# Patient Record
Sex: Female | Born: 1970 | Race: White | Hispanic: No
Health system: Southern US, Community
[De-identification: ages and names within clinical notes are randomized; demographics above are authoritative.]

## PROBLEM LIST (undated history)

## (undated) DIAGNOSIS — F329 Major depressive disorder, single episode, unspecified: Secondary | ICD-10-CM

## (undated) DIAGNOSIS — K529 Noninfective gastroenteritis and colitis, unspecified: Secondary | ICD-10-CM

## (undated) DIAGNOSIS — E039 Hypothyroidism, unspecified: Secondary | ICD-10-CM

## (undated) DIAGNOSIS — F32A Depression, unspecified: Secondary | ICD-10-CM

## (undated) DIAGNOSIS — I10 Essential (primary) hypertension: Secondary | ICD-10-CM

## (undated) HISTORY — PX: OTHER SURGICAL HISTORY: SHX169

## (undated) HISTORY — PX: BACK SURGERY: SHX140

## (undated) HISTORY — DX: Essential (primary) hypertension: I10

## (undated) HISTORY — PX: TYMPANOSTOMY TUBE PLACEMENT: SHX32

---

## 2005-05-07 DIAGNOSIS — R5383 Other fatigue: Secondary | ICD-10-CM | POA: Insufficient documentation

## 2005-05-07 DIAGNOSIS — D729 Disorder of white blood cells, unspecified: Secondary | ICD-10-CM | POA: Insufficient documentation

## 2005-05-07 DIAGNOSIS — R519 Headache, unspecified: Secondary | ICD-10-CM | POA: Insufficient documentation

## 2016-04-27 ENCOUNTER — Encounter: Payer: Self-pay | Admitting: Emergency Medicine

## 2016-04-27 ENCOUNTER — Emergency Department
Admission: EM | Admit: 2016-04-27 | Discharge: 2016-04-27 | Disposition: A | Discharge status: Home / Self Care | Payer: Self-pay | Attending: Emergency Medicine | Admitting: Emergency Medicine

## 2016-04-27 ENCOUNTER — Emergency Department: Payer: Self-pay

## 2016-04-27 DIAGNOSIS — M62838 Other muscle spasm: Secondary | ICD-10-CM | POA: Insufficient documentation

## 2016-04-27 DIAGNOSIS — M545 Low back pain: Secondary | ICD-10-CM | POA: Insufficient documentation

## 2016-04-27 DIAGNOSIS — G8929 Other chronic pain: Secondary | ICD-10-CM | POA: Insufficient documentation

## 2016-04-27 DIAGNOSIS — E039 Hypothyroidism, unspecified: Secondary | ICD-10-CM | POA: Insufficient documentation

## 2016-04-27 HISTORY — DX: Noninfective gastroenteritis and colitis, unspecified: K52.9

## 2016-04-27 HISTORY — DX: Major depressive disorder, single episode, unspecified: F32.9

## 2016-04-27 HISTORY — DX: Depression, unspecified: F32.A

## 2016-04-27 HISTORY — DX: Hypothyroidism, unspecified: E03.9

## 2016-04-27 MED ORDER — BACLOFEN 10 MG PO TABS: 10.0000 mg | ORAL_TABLET | Freq: Three times a day (TID) | ORAL | 0 refills | Status: AC | PRN

## 2016-04-27 NOTE — ED Triage Notes (Signed)
Pt reports 1 week ago she thought she had slept weird and started having right shoulder pain. Pt reports pain continues. Denies injury.

## 2016-04-27 NOTE — ED Provider Notes (Signed)
[facility] Emergency Department Provider Note  ____________________________________________  Time seen: Approximately 3:46 PM  I have reviewed the triage vital signs and the nursing notes.   HISTORY  Chief Complaint Shoulder Pain    HPI Anna HockeyJacqueline H Tumolo is a 45 y.o. female, NAD, presents to the emergency for 1 week history of right shoulder and neck pain.  Patient states she woke last Friday with right shoulder and neck pain that has progressively worsened over the last week. Has been taking over-the-counter ibuprofen as well as tramadol which she has prescription for back pain. States neither has helped alleviate her symptoms. States the right side of the neck and shoulder feel tight. Has not had any falls, injuries or traumas. Has not noted any redness or rashes.  Denies numbness, weakness, tingling. Has had no chest pain, shortness breath, abdominal pain, nausea or vomiting.   Past Medical History:  Diagnosis Date  . Colitis   . Depression   . Hypothyroidism     There are no active problems to display for this patient.   Past Surgical History:  Procedure Laterality Date  . BACK SURGERY    . CESAREAN SECTION    . uteral ablasion      Prior to Admission medications   Medication Sig Start Date End Date Taking? Authorizing Provider  baclofen (LIORESAL) 10 MG tablet Take 1 tablet (10 mg total) by mouth 3 (three) times daily as needed for muscle spasms. 04/27/16   Jamesyn Moorefield L Rayonna Heldman, PA-C    Allergies Fish allergy  No family history on file.  Social History Social History  Substance Use Topics  . Smoking status: Not on file  . Smokeless tobacco: Not on file  . Alcohol use Not on file     Review of Systems  Constitutional: No fever/chills, Fatigue Cardiovascular: No chest pain. Respiratory:  No shortness of breath. No wheezing.  Gastrointestinal: No abdominal pain.  No nausea, vomiting.   Musculoskeletal: Positive right shoulder and neck  pain. Positive chronic lower back pain. Skin: Negative for rash, Skin sores. Neurological: Negative for numbness, weakness, tingling. 10-point ROS otherwise negative.  ____________________________________________   PHYSICAL EXAM:  VITAL SIGNS: ED Triage Vitals [04/27/16 1326]  Enc Vitals Group     BP (!) 139/93     Pulse Rate 83     Resp 16     Temp 97.6 F (36.4 C)     Temp Source Oral     SpO2 96 %     Weight 250 lb (113.4 kg)     Height 5\' 7"  (1.702 m)     Head Circumference      Peak Flow      Pain Score 8     Pain Loc      Pain Edu?      Excl. in GC?     Constitutional: Alert and oriented. Well appearing and in no acute distress. Eyes: Conjunctivae are normal.  Head: Atraumatic. Neck:  Cervical spine tenderness to palpation. Supple with full range of motion but with pain with right lateral flexion and rotation. Right sided trapezial muscle spasm with mild tenderness to palpation. Cardiovascular: Normal rate, regular rhythm. Normal S1 and S2.  Good peripheral circulation. Respiratory: Normal respiratory effort without tachypnea or retractions. Lungs CTAB with breath sounds noted in all lung fields. Musculoskeletal: Decreased range of motion of the right shoulder with extension or and abduction getting to approximately 120. No tenderness to palpation about the deltoid or lateral upper right arm.  Negative Neer's. Negative Apley's. Grip strength is 5 out of 5 in bilateral hands. Strength of bilateral upper extremities is 5 out of 5. Neurologic:  Normal speech and language. No gross focal neurologic deficits are appreciated. Sensation to light touch grossly intact about the right upper extremity.  Skin:  Skin is warm, dry and intact. No rash noted. Psychiatric: Mood and affect are normal. Speech and behavior are normal. Patient exhibits appropriate insight and judgement.  ____________________________________________  RADIOLOGY I, Hope Pigeon, personally viewed and  evaluated these images (plain radiographs) as part of my medical decision making, as well as reviewing the written report by the radiologist.  Dg Shoulder Right  Result Date: 04/27/2016 CLINICAL DATA:  Right shoulder pain, no known injury, initial encounter EXAM: RIGHT SHOULDER - 2+ VIEW COMPARISON:  None. FINDINGS: There is no evidence of fracture or dislocation. There is no evidence of arthropathy or other focal bone abnormality. Soft tissues are unremarkable. IMPRESSION: No acute abnormality noted. Electronically Signed   By: Alcide Clever M.D.   On: 04/27/2016 15:37    ____________________________________________    PROCEDURES  Procedure(s) performed: None   Procedures   Medications - No data to display   ____________________________________________   INITIAL IMPRESSION / ASSESSMENT AND PLAN / ED COURSE  Pertinent labs & imaging results that were available during my care of the patient were reviewed by me and considered in my medical decision making (see chart for details).  Clinical Course    Patient's diagnosis is consistent with Trapezial muscle spasms. Patient will be discharged home with prescriptions for baclofen to take as directed. Patient advised to complete light range of motion and stretching exercises 20 minutes 3-4 times daily as discussed. Patient is to follow up with Dr. Hyacinth Meeker in orthopedics if symptoms persist past this treatment course. Patient is given ED precautions to return to the ED for any worsening or new symptoms.   ____________________________________________  FINAL CLINICAL IMPRESSION(S) / ED DIAGNOSES  Final diagnoses:  Trapezius muscle spasm      NEW MEDICATIONS STARTED DURING THIS VISIT:  Discharge Medication List as of 04/27/2016  4:02 PM    START taking these medications   Details  baclofen (LIORESAL) 10 MG tablet Take 1 tablet (10 mg total) by mouth 3 (three) times daily as needed for muscle spasms., Starting Fri 04/27/2016,  Print             Hope Pigeon, PA-C 04/27/16 1710    Nita Sickle, MD 04/28/16 1002

## 2017-06-22 IMAGING — CR DG SHOULDER 2+V*R*
1 series · 3 of 3 positions shown · non-contrast
Comparison: None.

CLINICAL DATA: Right shoulder pain, no known injury, initial
encounter

EXAM:
RIGHT SHOULDER - 2+ VIEW

[Series 1: dg shoulder right · 0.14mm/px · 3 of 3 slices shown]
[im 1/3]
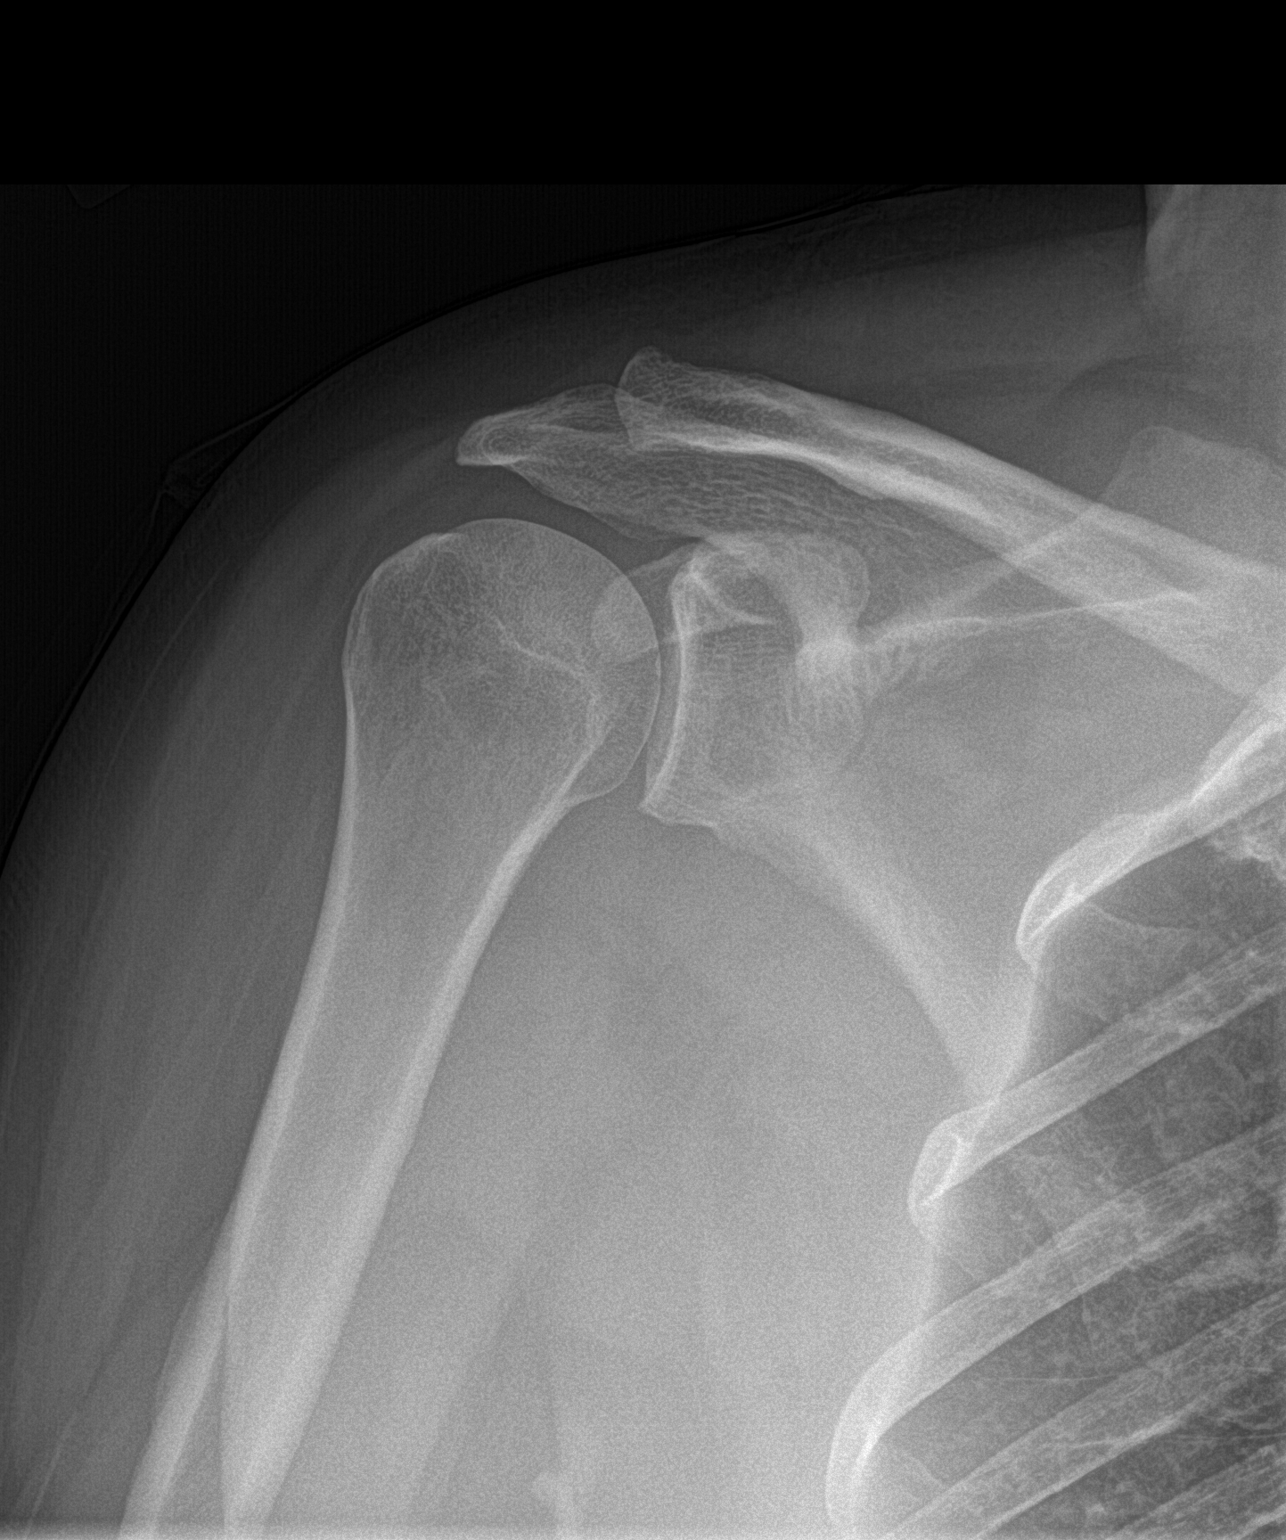
[im 2/3]
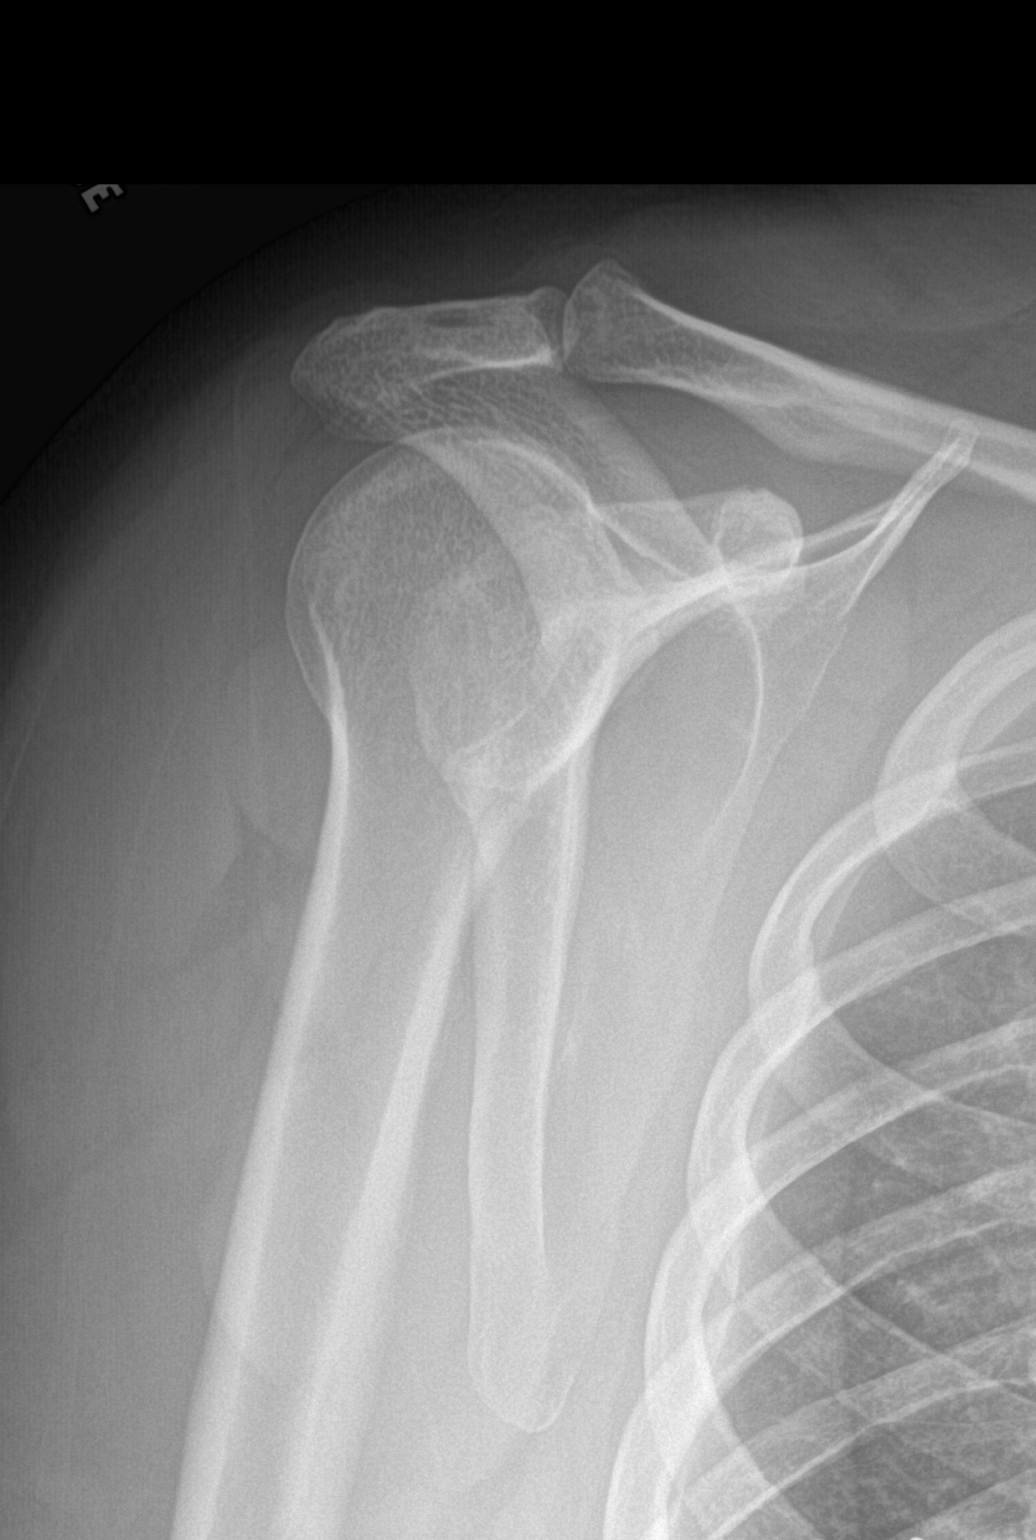
[im 3/3]
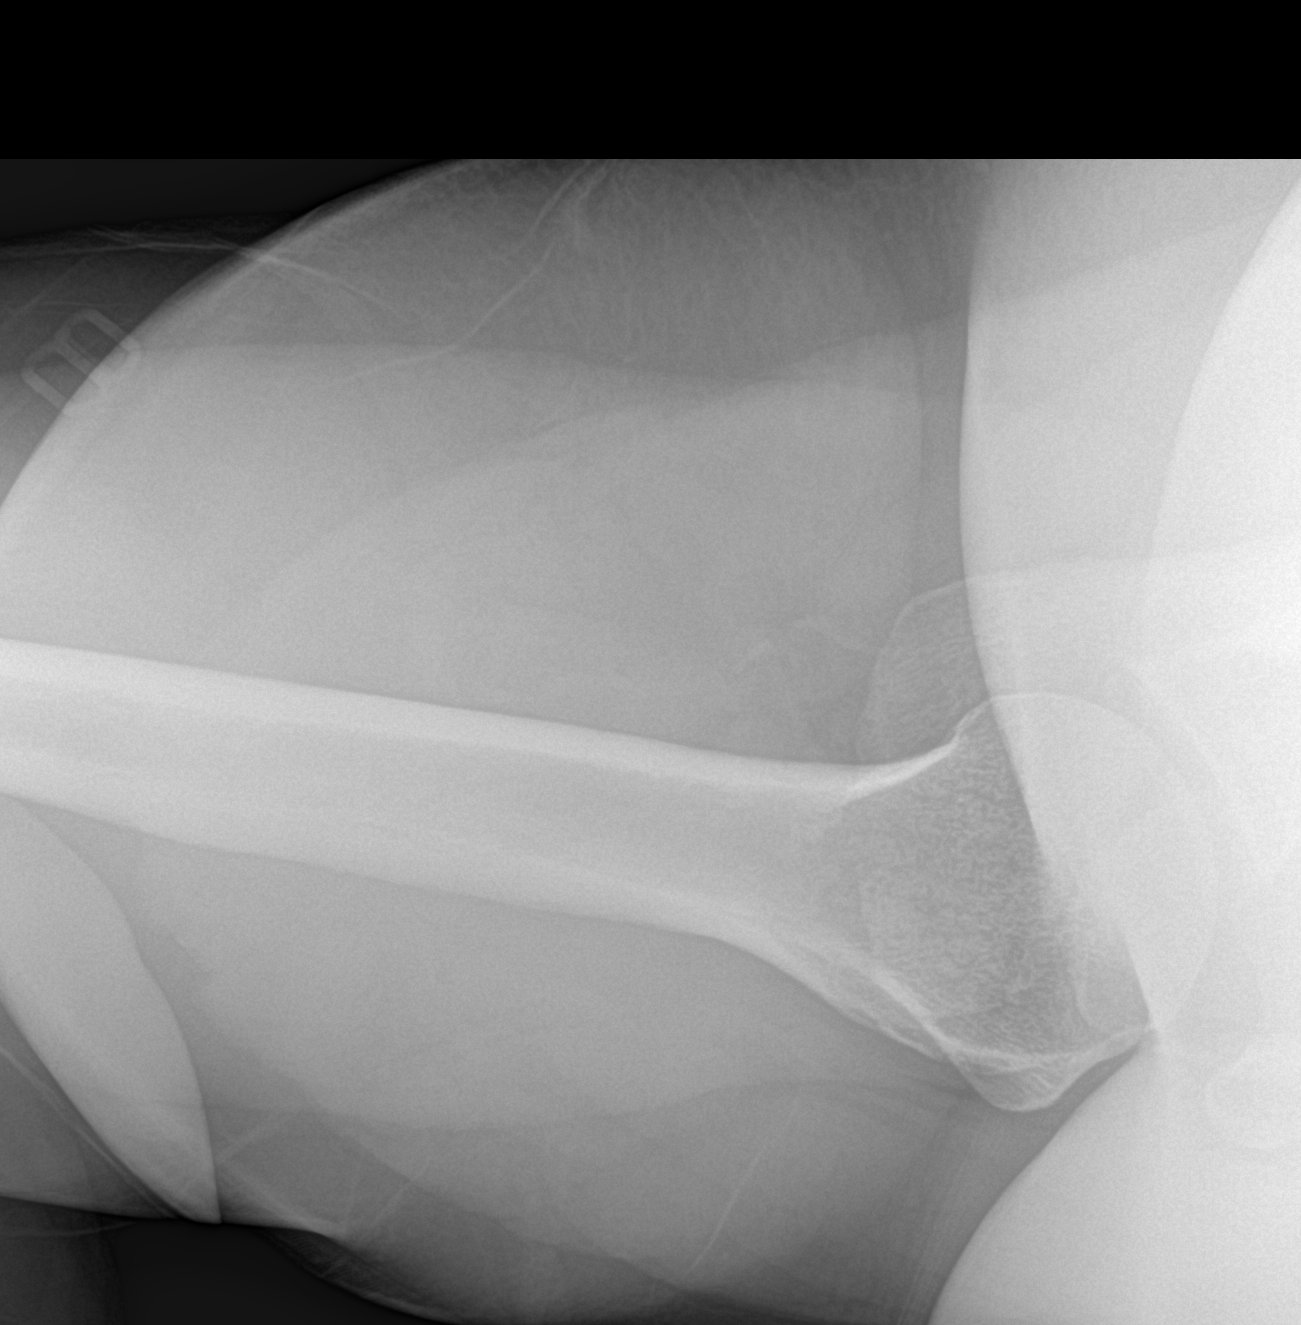

[3 of 3 positions shown; findings below may reference images not displayed]

FINDINGS: There is no evidence of fracture or dislocation. There is no
evidence of arthropathy or other focal bone abnormality. Soft
tissues are unremarkable.
IMPRESSION: No acute abnormality noted.

## 2017-09-05 ENCOUNTER — Ambulatory Visit
Admission: AD | Admit: 2017-09-05 | Discharge: 2017-09-05 | Disposition: A | Discharge status: Home / Self Care | Payer: PRIVATE HEALTH INSURANCE | Source: Ambulatory Visit

## 2017-09-05 DIAGNOSIS — H65112 Acute and subacute allergic otitis media (mucoid) (sanguinous) (serous), left ear: Secondary | ICD-10-CM

## 2017-09-05 DIAGNOSIS — R0600 Dyspnea, unspecified: Secondary | ICD-10-CM | POA: Diagnosis present

## 2017-09-05 MED ORDER — CIPROFLOXACIN-DEXAMETHASONE 0.3-0.1 % OT SUSP *I*
4.0000 [drp] | Freq: Two times a day (BID) | OTIC | 0 refills | Status: DC
Start: 2017-09-05 — End: 2017-09-05

## 2017-09-05 MED ORDER — ALBUTEROL SULFATE HFA 108 (90 BASE) MCG/ACT IN AERS *I*
2.0000 | INHALATION_SPRAY | RESPIRATORY_TRACT | 0 refills | Status: DC | PRN
Start: 2017-09-05 — End: 2019-03-30

## 2017-09-05 MED ORDER — NEOMYCIN-POLYMYXIN-HC 3.5-10000-1 OT SUSP *I*
3.0000 [drp] | Freq: Two times a day (BID) | OTIC | 0 refills | Status: AC
Start: 2017-09-05 — End: 2017-09-12

## 2017-09-05 NOTE — UC Provider Note (Signed)
History     Chief Complaint   Patient presents with    Otalgia     i got [place] comimg out both ears left more then right, chest congestion, started 2 days ago      Presents to clinic with complaints of cough, congestion, rhinitis, bilateral ear pain and drainage x1 day.  Denies F/C/N/V/D.  Reports dyspnea.  Smoker.  Declined flu vaccine.  Taking tylenol for pain.            Medical/Surgical/Family History     History reviewed. No pertinent past medical history.     Patient Active Problem List   Diagnosis Code    Acute mucoid otitis media of left ear H65.112    Dyspnea, unspecified type R06.00            Past Surgical History:   Procedure Laterality Date    CESAREAN SECTION, CLASSIC      l4 l5 fusion      laproscopy      TYMPANOSTOMY TUBE PLACEMENT      uterine ablation       No family history on file.       Social History   Substance Use Topics    Smoking status: Current Every Day Smoker     Packs/day: 0.50    Smokeless tobacco: Never Used    Alcohol use No     Living Situation     Questions Responses    Patient lives with     Homeless     Caregiver for other family member     External Services     Employment     Domestic Violence Risk                 Review of Systems   Review of Systems   Constitutional: Negative.    HENT: Positive for congestion, ear discharge and ear pain.    Eyes: Negative.    Respiratory: Positive for cough and shortness of breath.    Cardiovascular: Negative.    Gastrointestinal: Negative.    Genitourinary: Negative.    Musculoskeletal: Negative.    Skin: Negative.    Neurological: Negative.    Psychiatric/Behavioral: Negative.        Physical Exam   Triage Vitals  Triage Start: Start, (09/05/17 1109)   First Recorded BP: 131/79, Resp: 17, Temp: 36.1 C (97 F), Temp src: Oral Oxygen Therapy SpO2: 97 %, Oximetry Source: Lt Hand, O2 Device: None (Room air), Heart Rate: 89, (09/05/17 1112)  .  First Pain Reported  0-10 Scale: 3, Pain Location/Orientation: Ear Right;Ear Left,  (09/05/17 1112)       Physical Exam   Constitutional: She is oriented to person, place, and time. Vital signs are normal. She is active and cooperative.   HENT:   Left Ear: There is drainage (purulent).   Nose: Nose normal.   Mouth/Throat: Uvula is midline and oropharynx is clear and moist.   Bilateral PE tubes noted with left erthymea and edema of the ear canal and purulent drainage.     Eyes: Pupils are equal, round, and reactive to light. Conjunctivae and EOM are normal.   Neck: Normal range of motion. Neck supple.   Cardiovascular: Normal rate and regular rhythm.    Pulmonary/Chest: Effort normal. She has decreased breath sounds (bilateral).   Neurological: She is alert and oriented to person, place, and time.   Psychiatric: She has a normal mood and affect.   Vitals reviewed.  Medical Decision Making        Initial Evaluation:  ED First Provider Contact     Date/Time Event User Comments    09/05/17 1117 ED First Provider Contact Lora HavensBURKE, Aloma Boch L Initial Face to Face Provider Contact          Patient was seen on: 09/05/2017        Assessment:  47 y.o.female [facility] with   Chief Complaint   Patient presents with    Otalgia     i got us comimg out both ears left more then right, chest congestion, started 2 days ago          Differential Diagnosis includes:  URI  Otalgia  AOM  Dyspnea  Bronchitis    Plan: Albuterol inhaler as needed.  Mucinex for chest congestion.  ciprodex x1 week.         Final Diagnosis  Final diagnoses:   [H65.112] Acute mucoid otitis media of left ear (Primary)   [R06.00] Dyspnea, unspecified type         Marcha DuttonBrandi L Brown Dunlap, NP       I have reviewed nursing documentation, confirmed information with patient, and revised with nursing as necessary.       Marcha DuttonBurke, Mayola Mcbain L, NP  09/05/17 1128

## 2017-09-05 NOTE — Discharge Instructions (Signed)
1. Ciprodex x1 week  2. Tylenol/ibuprofen for pain and fever.  3. Apply warm compresses to affected ear.  4. Follow up with PCP if s/s persist.    Albuterol inhaler as needed.  Mucinex for chest congestion.

## 2017-09-23 DIAGNOSIS — E039 Hypothyroidism, unspecified: Secondary | ICD-10-CM | POA: Insufficient documentation

## 2017-09-23 DIAGNOSIS — R030 Elevated blood-pressure reading, without diagnosis of hypertension: Secondary | ICD-10-CM | POA: Insufficient documentation

## 2018-01-03 DIAGNOSIS — R143 Flatulence: Secondary | ICD-10-CM | POA: Insufficient documentation

## 2018-01-03 DIAGNOSIS — R197 Diarrhea, unspecified: Secondary | ICD-10-CM | POA: Insufficient documentation

## 2018-01-03 DIAGNOSIS — R1084 Generalized abdominal pain: Secondary | ICD-10-CM | POA: Insufficient documentation

## 2018-01-28 NOTE — H&P (Signed)
[facility]      PATIENT: Jaime HockeyJacqueline H Palecek MR #: [id number]   PCP: Ruben ReasonWalkreinard, Lisa L, MD DOB: 12/11/1970   AUTHORED BY: Dionne AnoAYMOND M Crissie Aloi, MD PROCEDURE DATE: 01/30/2018       Chief Complaint: Colonoscopy/Colitis    History of Present Illness  Pt is a 47 y.o.  female presents [facility] center for colonoscopy.  No past medical history on file.  Past Surgical History:   Procedure Laterality Date    CESAREAN SECTION, CLASSIC      l4 l5 fusion      laproscopy      TYMPANOSTOMY TUBE PLACEMENT      uterine ablation       No Known Allergies (drug, envir, food or latex)  Current Outpatient Prescriptions   Medication    levothyroxine (SYNTHROID, LEVOTHROID) 75 MCG tablet    FLUoxetine (PROZAC) 40 MG capsule    albuterol HFA 108 (90 Base) MCG/ACT inhaler     No current facility-administered medications for this encounter.      Social History     Social History    Marital status: Divorced     Spouse name: N/A    Number of children: N/A    Years of education: N/A     Occupational History    Not on file.     Social History Main Topics    Smoking status: Current Every Day Smoker     Packs/day: 0.50    Smokeless tobacco: Never Used    Alcohol use No    Drug use: No    Sexual activity: Not on file     Social History Narrative    No narrative on file       No family history on file.      Review of Systems:  Pertinent items are noted in HPI.    Physical Exam:  There were no vitals filed for this visit.  Neuro: A+O x 3  Chest: CTA  Cor: RRR  Abdomen is soft, nontender, nondistended with no palpable masses.     Assessment and Plan:  I discussed the risks, benefits, and alternatives for the procedure.   Plan for Colonoscopy  ASA 2  Oral airway satisfactory      Electronically signed by: Dionne AnoAYMOND M Safi Culotta, MD

## 2018-01-30 ENCOUNTER — Ambulatory Visit
Admission: RE | Admit: 2018-01-30 | Discharge: 2018-01-30 | Disposition: A | Discharge status: Home / Self Care | Payer: PRIVATE HEALTH INSURANCE | Source: Ambulatory Visit | Attending: Internal Medicine | Admitting: Internal Medicine

## 2018-01-30 ENCOUNTER — Encounter: Payer: Self-pay | Admitting: Internal Medicine

## 2018-01-30 DIAGNOSIS — Y730 Diagnostic and monitoring gastroenterology and urology devices associated with adverse incidents: Secondary | ICD-10-CM | POA: Insufficient documentation

## 2018-01-30 DIAGNOSIS — K648 Other hemorrhoids: Secondary | ICD-10-CM | POA: Insufficient documentation

## 2018-01-30 DIAGNOSIS — K529 Noninfective gastroenteritis and colitis, unspecified: Secondary | ICD-10-CM | POA: Insufficient documentation

## 2018-01-30 DIAGNOSIS — Y848 Other medical procedures as the cause of abnormal reaction of the patient, or of later complication, without mention of misadventure at the time of the procedure: Secondary | ICD-10-CM | POA: Insufficient documentation

## 2018-01-30 DIAGNOSIS — K6389 Other specified diseases of intestine: Secondary | ICD-10-CM | POA: Insufficient documentation

## 2018-01-30 DIAGNOSIS — Y92238 Other place in hospital as the place of occurrence of the external cause: Secondary | ICD-10-CM | POA: Insufficient documentation

## 2018-01-30 DIAGNOSIS — T8852XA Failed moderate sedation during procedure, initial encounter: Secondary | ICD-10-CM | POA: Insufficient documentation

## 2018-01-30 HISTORY — DX: Depression, unspecified: F32.A

## 2018-01-30 MED ORDER — ONDANSETRON HCL 2 MG/ML IV SOLN *I*
INTRAMUSCULAR | Status: AC
Start: 2018-01-30 — End: 2018-01-30
  Filled 2018-01-30: qty 2

## 2018-01-30 MED ORDER — MIDAZOLAM HCL 5 MG/ML IJ SOLUTION *WRAPPED*
INTRAMUSCULAR | Status: AC
Start: 2018-01-30 — End: 2018-01-30
  Filled 2018-01-30: qty 2

## 2018-01-30 MED ORDER — FENTANYL CITRATE 50 MCG/ML IJ SOLN *WRAPPED*
INTRAMUSCULAR | Status: AC | PRN
Start: 2018-01-30 — End: 2018-01-30
  Administered 2018-01-30: 100 ug via INTRAVENOUS

## 2018-01-30 MED ORDER — FENTANYL CITRATE 50 MCG/ML IJ SOLN *WRAPPED*
INTRAMUSCULAR | Status: AC
Start: 2018-01-30 — End: 2018-01-30
  Filled 2018-01-30: qty 2

## 2018-01-30 MED ORDER — ONDANSETRON HCL 2 MG/ML IV SOLN *I*
INTRAMUSCULAR | Status: AC | PRN
Start: 2018-01-30 — End: 2018-01-30
  Administered 2018-01-30: 4 mg via INTRAVENOUS

## 2018-01-30 MED ORDER — MIDAZOLAM HCL 5 MG/ML IJ SOLUTION *WRAPPED*
INTRAMUSCULAR | Status: AC | PRN
Start: 2018-01-30 — End: 2018-01-30
  Administered 2018-01-30: 2 mg via INTRAVENOUS
  Administered 2018-01-30: 3 mg via INTRAVENOUS
  Administered 2018-01-30: 5 mg via INTRAVENOUS

## 2018-01-30 NOTE — Procedures (Addendum)
Procedure Report    [facility]      PATIENT: Jaime HockeyJacqueline H Frei MR #: [id number]   PCP: Ruben ReasonWalkreinard, Lisa L, MD DOB: 01/10/1971   DICTATED BY: Dionne AnoAYMOND M Iseah Plouff, MD PROCEDURE DATE: 01/30/2018       Screening Colonoscopy    PCP:  Ruben ReasonWalkreinard, Lisa L, MD     The patient denies changes in her bowels or blood in her stools. The patient was examined prior to the procedure.    The cardiac exam revealed normal S1/S2 without S3 or murmurs.  The pulmonary exam revealed normal breath sounds.  The patient was deemed an ASA Class  2.  The patient was examined prior to the procedure, and her  questions were answered to her satisfaction. The patient elected to undergo a colonoscopy.    Medicines Given During the Procedure:       Medication Administration - last 24 hours from 01/30/2018 1202 to 01/30/2018 1210       Date/Time Order Dose Route Action Action by     01/30/2018 1208 fentaNYL (SUBLIMAZE) 50 mcg/mL injection 100 mcg Intravenous Given Dionne Anohomas, Deon Duer M, MD     01/30/2018 1209 midazolam (VERSED) 5 mg/mL injection 5 mg Intravenous Given Dionne Anohomas, Jariah Jarmon M, MD          The Olympus variable tension colonoscope was advanced to the right transverse colon with moderate difficulty. The scope was withdrawn through the entire colon. The prep was good. A J-maneuver was performed in the rectum and there were small internal hemorrhoids. Random biopsies were obtained    Colonoscopy to the right transverse without polyps, no inflammatory mucosal changes and no diverticula.    I recommend a follow-up colonoscopy in 1 years' time. The patient was instructed to call if they have any new problems, i.e. diarrhea, constipation, lower abdominal cramps, changes associated with hematochezia, or if the patient is hemoccult positive. In that case, a colonoscopy may need to be performed again. The colonoscopy was incomplete due to inadequate sedation.  We will treat for IBS with fiber/probiotics and Levisen SL.  If she is still having  problems, a colonoscopy with anesthesia may be indicated.  She should have anesthesia with all future colonoscopies      Electronically signed by: Dionne AnoAYMOND M Tonnia Bardin, MD

## 2018-01-30 NOTE — Discharge Instructions (Signed)
Discharge Instructions: Colonoscopy    Procedure: Colonoscopy      Special Instructions:   You have received medication that has a sedating/amnesic effect and therefore interferes with your memory and normal reaction time. DO NOT operate motor vehicles, machinery or power tools for 24 hours. Do not sign any legal documents for 24 hours. Do not make any major decisions. Do not consume alcohol. Rest at home with only light activity for the rest of today and resume normal activities tomorrow. If you use a CPAP machine for sleep, use your CPAP when you are resting at home after the procedure.   If you develop abdominal pain, nausea or vomiting, fever, chills, bleeding that does not stop, contact your physician.    Call your physician for any persistent changes in bowel habits: (i.e., diarrhea, constipation, bloody stools change in stool shape or size or cramping.     Medications:   Resume all previous medications unless instructed as above.    Follow-Up Care:   Have stools tested for blood yearly with annual physical.   Follow up colonoscopy in 1years.    No ASA or NSAIDs for 3 days    Diet: regular     Follow-Up Appointment: Thursday august 29th at 9:45am with Sallye OberLouise    If You Have A Problem:      Dr. Maisie Fushomas: [phone]    [facility]     Emergency Department  [phone]    [phone]  7AM to 4PM weekdays   24 hours per day      Electronically signed by: Dionne AnoAYMOND M THOMAS, MD

## 2018-01-31 LAB — SURGICAL PATHOLOGY

## 2018-11-01 DIAGNOSIS — F419 Anxiety disorder, unspecified: Secondary | ICD-10-CM | POA: Insufficient documentation

## 2019-03-30 ENCOUNTER — Ambulatory Visit: Payer: PRIVATE HEALTH INSURANCE | Admitting: Rheumatology

## 2019-03-30 ENCOUNTER — Other Ambulatory Visit: Payer: Self-pay | Admitting: Rheumatology

## 2019-03-30 ENCOUNTER — Encounter: Payer: Self-pay | Admitting: Rheumatology

## 2019-03-30 DIAGNOSIS — M7501 Adhesive capsulitis of right shoulder: Secondary | ICD-10-CM

## 2019-03-30 DIAGNOSIS — M25511 Pain in right shoulder: Secondary | ICD-10-CM

## 2019-03-30 DIAGNOSIS — M67911 Unspecified disorder of synovium and tendon, right shoulder: Secondary | ICD-10-CM

## 2019-03-30 MED ORDER — DICLOFENAC SODIUM 1 % EX GEL *I*
CUTANEOUS | 2 refills | Status: DC
Start: 2019-03-30 — End: 2022-08-06

## 2019-03-30 NOTE — Progress Notes (Signed)
Chief Complaint: right shoulder pain    History of Present Illness: A 48 yo female presents right shoulder pain that started about 3 weeks ago.  She has constant pain that is a 5/10.  Goes up with activity.  Pain increases overhead.  She is taking meloxicam.  No previous injury.  She has pain at night. No PT, cortisone, or MRI.   She has hypothyroidism.  She is a smoker.     For past medical history, medications, allergies, past surgical history, social history, family history, and review of systems reviewed  Past Medical History:   Diagnosis Date    Depression      Past Surgical History:   Procedure Laterality Date    CESAREAN SECTION, CLASSIC      l4 l5 fusion      laproscopy      TYMPANOSTOMY TUBE PLACEMENT      uterine ablation       No family history on file.  Social History     Socioeconomic History    Marital status: Single     Spouse name: Not on file    Number of children: Not on file    Years of education: Not on file    Highest education level: Not on file   Tobacco Use    Smoking status: Current Every Day Smoker     Packs/day: 0.50    Smokeless tobacco: Never Used   Substance and Sexual Activity    Alcohol use: No    Drug use: No    Sexual activity: Not on file   Other Topics Concern    Not on file   Social History Narrative    Not on file     No family status information on file.       Allergies:  Allergies   Allergen Reactions    Shellfish-Derived Products Hives     Hives   All fish products     Medications:  Current Outpatient Medications   Medication Sig    fluticasone (FLONASE) 50 MCG/ACT nasal spray 1 spray by Nasal route daily    meloxicam (MOBIC) 15 MG tablet Take 15 mg by mouth daily    buPROPion (WELLBUTRIN XL) 150 MG 24 hr tablet take 1 tablet by mouth once daily    traMADol (ULTRAM) 50 MG tablet 1 tab tid prn    levothyroxine (SYNTHROID, LEVOTHROID) 50 MCG tablet Take 50 mcg by mouth daily    diclofenac (VOLTAREN) 1 % gel Apply 4 g to affected area 4 times daily.     ondansetron (ZOFRAN-ODT) 4 MG disintegrating tablet Take 4 mg by mouth 2 times daily as needed    albuterol HFA (PROVENTIL HFA) 108 (90 Base) MCG/ACT inhaler Inhale 2 puffs into the lungs     No current facility-administered medications for this visit.        Physical Exam: A pleasant female in no acute distress.  Skin is pink, warm, and dry.    Right SHOULDER:  Point tender along subacromial space, Codman's point, ac joint, anterior GH, axilla .  Shoulder flexion to approximately 85.  Shoulder abduction to approximately 50.External rotation at 0 degrees abduction approximately  5/5 strength with elbow flexion, elbow extension, wrist flexion, wrist extension, and grip strength.  4+/5 supraspinatus, 5/ 5 infraspinatus,  5/5 subscapularis+ impingement,  deferred O'brien's, deferred crossover.  + pain with internal and external rotation of the shoulder at 0 degrees of abduction.  deferred scapular dyskinesis.  Sensation is intact along medial  upper arm, lateral upper arm, thumb, middle, and small fingers.  Able to extend thumb, middle, and small fingers.      Imaging: I personally reviewed and interpreted the images.  XR 03/30/2019 right shoulder demonstrates no acute fractures or dislocations, signs of rotator cuff pathology, ac joint arthritis    Assessment:     1. Acute pain of right shoulder  *Shoulder RIGHT standard AP, Grashey, and Lateral views    AMB REFERRAL TO PHYSICAL / OCCUPATIONAL THERAPY   2. Rotator cuff dysfunction, right  AMB REFERRAL TO PHYSICAL / OCCUPATIONAL THERAPY   3. Adhesive capsulitis of right shoulder  AMB REFERRAL TO PHYSICAL / OCCUPATIONAL THERAPY        Plan: Discussed that it appears she may have the beginning of adhesive capsulitis and rotator cuff dysfunction.  Discussed the natural course of adhesive capsulitis especially with given risk factors.  May consider a cortisone injection, patient declines.  She will start with PT to work on strengthening and ROM.  Discussed depending on how  she progresses she may need to limit her use of shoulder and once she is pain free to progress with PT.  If she changes her mind on the injection she may call the office.  She reports fainting with any needle. Also discussed smoking cessation and recommended discussing with PCP if she would like assistance with quitting.   Patient is in agreement with this plan.  All questions are welcomed and answered.

## 2019-05-15 ENCOUNTER — Ambulatory Visit: Payer: PRIVATE HEALTH INSURANCE | Admitting: Rheumatology

## 2019-07-12 ENCOUNTER — Encounter: Payer: Self-pay | Admitting: Gastroenterology

## 2019-07-13 ENCOUNTER — Encounter: Payer: Self-pay | Admitting: Gastroenterology

## 2019-07-23 ENCOUNTER — Ambulatory Visit: Payer: PRIVATE HEALTH INSURANCE | Admitting: Cardiology

## 2019-07-23 ENCOUNTER — Encounter: Payer: Self-pay | Admitting: Cardiology

## 2019-07-23 VITALS — BP 160/100 | HR 94 | Ht 67.0 in | Wt 251.0 lb

## 2019-07-23 DIAGNOSIS — R079 Chest pain, unspecified: Secondary | ICD-10-CM

## 2019-07-23 DIAGNOSIS — I10 Essential (primary) hypertension: Secondary | ICD-10-CM | POA: Insufficient documentation

## 2019-07-23 MED ORDER — IRBESARTAN 300 MG PO TABS *I*
300.0000 mg | ORAL_TABLET | Freq: Every day | ORAL | 11 refills | Status: DC
Start: 2019-07-23 — End: 2022-08-06

## 2019-07-23 NOTE — Progress Notes (Signed)
CARDIOLOGY CONSULTATION    I had the pleasure of seeing Jaime Watts in consultation for further evaluation of chest discomfort and hypertension.  The patient is a smoker with no known cardiac history.  She reports that over the past month or so she has had intermittent episodes of chest discomfort.  She describes a pressure sensation in her left upper chest.  These episodes have typically been nonexertional.  On occasion they have radiated to her left neck region.  She has had some occasional diaphoresis but this seems independent of her chest pain.  Her breathing has been good.  No orthopnea or edema.  She did seek care in the emergency room in early January for her chest discomfort.  There, her EKG showed no acute ischemic changes and serial troponins ruled out MI.  She was quite hypertensive in the emergency room.  She notes that her blood pressure has typically been running high away from the hospital as well.    PAST MEDICAL HISTORY:  Past Medical History:   Diagnosis Date    Depression        PAST SURGICAL HISTORY:  Past Surgical History:   Procedure Laterality Date    CESAREAN SECTION, CLASSIC      l4 l5 fusion      laproscopy      TYMPANOSTOMY TUBE PLACEMENT      uterine ablation         MEDICATIONS:  Current Outpatient Medications   Medication Sig    hydroCHLOROthiazide (MICROZIDE) 12.5 MG capsule Take 12.5 mg by mouth every morning    loratadine (CLARITIN) 10 MG tablet Take 10 mg by mouth daily    losartan (COZAAR) 100 MG tablet Take 100 mg by mouth daily    fluticasone (FLONASE) 50 MCG/ACT nasal spray 1 spray by Nasal route daily    meloxicam (MOBIC) 15 MG tablet Take 15 mg by mouth daily    buPROPion (WELLBUTRIN XL) 150 MG 24 hr tablet take 1 tablet by mouth once daily    traMADol (ULTRAM) 50 MG tablet 1 tab tid prn    diclofenac (VOLTAREN) 1 % gel Apply 4 g to affected area 4 times daily.    levothyroxine (SYNTHROID, LEVOTHROID) 50 MCG tablet Take 50 mcg by mouth daily     irbesartan (AVAPRO) 300 MG tablet Take 1 tablet (300 mg total) by mouth daily    ondansetron (ZOFRAN-ODT) 4 MG disintegrating tablet Take 4 mg by mouth 2 times daily as needed     No current facility-administered medications for this visit.        ALLERGIES:  Allergies   Allergen Reactions    Lisinopril Cough    Shellfish-Derived Products Hives     Hives   All fish products       SOCIAL HISTORY:  The patient  reports that she has been smoking cigarettes. She has been smoking about 0.50 packs per day. She has never used smokeless tobacco. She reports that she does not drink alcohol or use drugs.    FAMILY HISTORY:  family history is not on file.    REVIEW OF SYSTEMS:    A 10+ prior history of systems was obtained.  Pertinent positives and negatives per HPI.  All other systems negative.    PHYSICAL EXAM:  Blood pressure (!) 160/100, pulse 94, height 1.702 m (5\' 7" ), weight 113.9 kg (251 lb)., Body mass index is 39.31 kg/m.Marland Kitchen   GEN: Pleasant, well-appearing, no acute distress. HEENT: Sclerae clear, mucous membranes moist.  LUNGS: clear to ausculation bilaterally, no wheezes or crackles. Complete CV exam performed, details include: Regular rate and rhythm, normal S1 and S2, no S3 or S4, no murmur, no rub. JVP not elevated. No carotid bruits. No abd bruits. Ext: no edema, intact pedal pulses bilaterally. GI: abd soft, nontender, nondistended, normal bowel sounds, no masses or organomegally. NEURO/PSYCH: Normal affect. No gross focal deficits.    EKG: From the ED shows sinus rhythm at 93 bpm with normal axis and normal intervals.  No Q waves or significant ST-T wave abnormalities present.    LABS/DATA: Sodium 140, potassium 3.8, BUN 14, creatinine 0.9, transaminases normal, LDL 125    IMPRESSION/PLAN:  1.  Chest discomfort.  In a 49 year old woman with several cardiovascular risk factors but no known history of coronary disease.  Her chest discomfort has been nonexertional and was therefore atypical for myocardial  ischemia.  However, with her risk profile, I do think stress testing is indicated.  We will plan for a stress echo in the near future.  2.  Hypertension.  Blood pressure typically in the 150 range at home.  I recommended switching from losartan to irbesartan 300 mg a day to better control her blood pressure.  Increasing her hydrochlorothiazide or switching to chlorthalidone may be an option in the future.  Amlodipine may be another option as well.  We also discussed low-salt diet, regular exercise, weight loss, and smoking cessation as nonpharmacologic means of controlling her blood pressure long-term.  3.  Lipid status.  Calculated 10-year cardiovascular event rate is 9.9%.  However, if her blood pressure well controlled it would be under 7% and if she stop smoking it would be under 3%.  We discussed statin therapy today but she would prefer to hold off.  I think this is very reasonable.  4.  Tobacco abuse.  Smoking cessation recommended as above.  She is cut down considerably recently and I recommended she try to quit completely.    Thank you for the opportunity to participate in the care of this very pleasant patient.  We will plan for followup after the upcoming stress test.

## 2019-08-02 ENCOUNTER — Other Ambulatory Visit
Admission: RE | Admit: 2019-08-02 | Discharge: 2019-08-02 | Disposition: A | Discharge status: Home / Self Care | Payer: PRIVATE HEALTH INSURANCE | Source: Ambulatory Visit | Attending: Cardiology | Admitting: Cardiology

## 2019-08-02 DIAGNOSIS — Z20822 Contact with and (suspected) exposure to covid-19: Secondary | ICD-10-CM | POA: Insufficient documentation

## 2019-08-02 DIAGNOSIS — Z20828 Contact with and (suspected) exposure to other viral communicable diseases: Secondary | ICD-10-CM | POA: Insufficient documentation

## 2019-08-03 LAB — COVID-19 NAAT (PCR): COVID-19 NAAT (PCR): NEGATIVE

## 2019-08-03 LAB — COVID-19 PCR

## 2019-08-07 ENCOUNTER — Ambulatory Visit: Payer: PRIVATE HEALTH INSURANCE | Admitting: Cardiology

## 2019-08-07 ENCOUNTER — Other Ambulatory Visit: Payer: PRIVATE HEALTH INSURANCE

## 2019-08-13 DIAGNOSIS — Z720 Tobacco use: Secondary | ICD-10-CM | POA: Insufficient documentation

## 2019-08-13 DIAGNOSIS — Z604 Social exclusion and rejection: Secondary | ICD-10-CM | POA: Insufficient documentation

## 2020-01-27 DIAGNOSIS — B009 Herpesviral infection, unspecified: Secondary | ICD-10-CM | POA: Insufficient documentation

## 2020-09-02 ENCOUNTER — Encounter: Payer: Self-pay | Admitting: Otolaryngology

## 2020-09-02 ENCOUNTER — Ambulatory Visit: Payer: PRIVATE HEALTH INSURANCE | Admitting: Otolaryngology

## 2020-09-02 VITALS — BP 123/80 | Temp 97.7°F | Ht 67.0 in | Wt 268.0 lb

## 2020-09-02 DIAGNOSIS — H6983 Other specified disorders of Eustachian tube, bilateral: Secondary | ICD-10-CM

## 2020-09-02 DIAGNOSIS — H906 Mixed conductive and sensorineural hearing loss, bilateral: Secondary | ICD-10-CM

## 2020-09-02 DIAGNOSIS — H65491 Other chronic nonsuppurative otitis media, right ear: Secondary | ICD-10-CM

## 2020-09-02 NOTE — Progress Notes (Signed)
Jaime Watts was referred by Dr. Paulina Fusi.    Subjective  Chief Complaint: She presents today for ear concerns    HPI: This is a 50 y.o. old female, who has been followed for many years for bilateral eustachian tube dysfunction at another ENT practice.  Her previous otolaryngologist is no longer in the area.  She feels like her ears blocked up again a few months after the tubes were taken out.  She says they were taken out because they wanted to see how she would do without them.  She definitely feels better when they are in.  She notices hearing loss and a significant sense of fullness.  Hearing loss probably worse on the right.  She reports that when she has tube otorrhea, it is generally not difficult to manage.  She did not have any ear trouble as a child.  Intranasal medications and oral allergy medications have not been effective at all for her.    We have multiple office notes from [facility] (Dr. Teodoro Spray) dating back to 2015.  I do not have any audiometric data but, according to the office notes, when tubes were not in place she had  "mild negative pressure" on her tympanograms.  She has variably had T tubes placed and removed over the years and has occasionally had some issues with otorrhea.  Notes we have date from 71 through 2021.  The last note I have is from 10/30/2019 at which time her tubes were removed in the office.     She smokes one to 2 cigarettes a day.     Outpatient Medications Marked as Taking for the 09/02/20 encounter (Office Visit) with Donella Stade, MD   Medication Sig Dispense Refill    albuterol HFA (PROVENTIL, VENTOLIN, PROAIR HFA) 108 (90 Base) MCG/ACT inhaler SMARTSIG:1 Puff(s) Via Inhaler Every 6 Hours PRN      RA VITAMIN D-3 25 MCG (1000 UT) tablet Take 1,000 units by mouth daily      valACYclovir (VALTREX) 1 gm tablet Take 1 tablet by mouth daily      hydroCHLOROthiazide (MICROZIDE) 12.5 MG capsule Take 12.5 mg by mouth every morning      loratadine (CLARITIN)  10 MG tablet Take 10 mg by mouth daily      fluticasone (FLONASE) 50 MCG/ACT nasal spray 1 spray by Nasal route daily      meloxicam (MOBIC) 15 MG tablet Take 15 mg by mouth daily      buPROPion (WELLBUTRIN XL) 150 MG 24 hr tablet take 1 tablet by mouth once daily      traMADol (ULTRAM) 50 MG tablet 1 tab tid prn      diclofenac (VOLTAREN) 1 % gel Apply 4 g to affected area 4 times daily. 1 Tube 2    levothyroxine (SYNTHROID, LEVOTHROID) 50 MCG tablet Take 50 mcg by mouth daily         Medication allergies: Lisinopril and Shellfish-derived products    Problem List: has Acute mucoid otitis media of left ear; Dyspnea, unspecified type; and HTN (hypertension) on their problem list.    Medical History:   Past Medical History:   Diagnosis Date    Depression        Surgical History:   Past Surgical History:   Procedure Laterality Date    CESAREAN SECTION, CLASSIC      l4 l5 fusion      laproscopy      TYMPANOSTOMY TUBE PLACEMENT      uterine ablation  Family History: family history is not on file.    Social History:   Social History     Tobacco Use    Smoking status: Current Every Day Smoker     Packs/day: 0.50     Types: Cigarettes    Smokeless tobacco: Never Used   Substance Use Topics    Alcohol use: No       ROS: ENT ROS: as above    Objective  Vitals:    09/02/20 1359   BP: 123/80   Temp: 36.5 C (97.7 F)   Weight: 121.6 kg (268 lb)   Height: 1.702 m (5\' 7" )         Exam  Constitution:  Appears well developed and well nourished. No signs of acute distress present. Patient is cooperative and overall behavior is appropriate.    Head / Face: Atraumatic, normocephalic on inspection.    Eyes:  EOMI in both eyes. Conjunctivae clear. No periorbital edema of the upper and lower lids. Sclerae clear.     Ears: Inspection reveals no lesion of the ears, swelling of the ears, or tenderness of the ears.  No discharge, mass or stenosis in the auditory canals.     Tympanic membranes -retraction and thinning  on the left.  On the right she has retraction and a clear effusion.    Nose: External Nose: exhibits no deformity or lesions. Internal Nose: No discharge from the nasal mucosae, no edema, no erythema of the nasal mucosae. Nasal Septum: not significantly deviated or perforated. Nasal turbinates are normal in color and size.    Oral cavity: Lips appear normal and healthy. Dentition is normal for age. Gums appear healthy. Tongue shows a smooth surface and symmetry. Floor of the mouth appears normal. Salivary glands normal in size with no asymmetry. Submandibular and parotid ducts are patent bilaterally. Oral mucosa moist with no thrush and no mucositis. Hard palate normal in appearance.    Oropharynx: No lesions or masses. Soft palate normal in appearance. Uvula midline and normal in size. Tonsils appear normal. Posterior pharyngeal mucosa appears normal.    Neck: Symmetric. Palpation reveals no swelling or tenderness. No masses appreciated. Trachea is midline and has good landmarks. Thyroid exhibits no palpable enlargement, nodules or tenderness on palpation.    Neurological: Alert and oriented x 3.  Psychological: Mood is normal. Patient's affect is appropriate to mood. Speech is spontaneous with regular rate, rhythm, and volume.    Audiogram: Right -   normal to borderline normal hearing, with conductive component at 1 and 4 kHz.  Left -normal to borderline normal hearing.  Possible conductive component at 1 kHz  Speech testing:   Speech reception: Right - 20.  Left - 15.   Discrimination: Right - 100%  Left - 100%  Tympanometry:  Right: Type B                                 Left: Type C     MYRINGOTOMY AND TUBE BILATERAL  Each ear is examined under the microscope. Effusion confirmed on the right.  Topical phenol used for anesthesia on the TM inferiorly.  Myringotomy made and very thick, mucoid effusion suctioned on the right.  Tube placed without difficulty in each ear.  Patient tolerated the procedure well.  Tube  type: Straight Vent Tube.  Lot #: .  Exp. 2023-03-06 (note - same for both ears)      Assessment  ICD-10-CM ICD-9-CM    1. COME (chronic otitis media with effusion), right  H65.491 381.3    2. ETD (Eustachian tube dysfunction), bilateral  H69.83 381.81    3. Mixed hearing loss, bilateral  H90.6 389.22         Plan  Audiogram and tympanogram consistent with eustachian tube dysfunction bilaterally.  Symptoms are chronic and she seems to do much better with tubes in place.  Tubes placed as above.  Good subjective improvement in hearing, especially on the right.  She will call with any questions or concerns, particularly if there is otorrhea.  We will see her again in 3 months routinely with an audiogram.    BMTT recommended.   The nature of the procedure, risks, benefits and alternatives (including no treatment) were discussed with the patient / caregiver / family.    All questions were answered and they wish to proceed.  The risks include but are not limited to bleeding, infection, reaction to anesthesia, failure to relieve symptoms, the need for further surgical or medical treatment, early or late tube extrusion, TM perforation, cyst formation, drainage from ears.    Discussed that although we certainly see ETD in non-smokers, there is evidence that smoking exacerbates this condition    Seen by : Donella Stade, MD 09/02/2020

## 2020-12-02 ENCOUNTER — Ambulatory Visit: Payer: PRIVATE HEALTH INSURANCE | Admitting: Otolaryngology

## 2021-02-01 ENCOUNTER — Ambulatory Visit: Payer: PRIVATE HEALTH INSURANCE | Admitting: Otolaryngology

## 2021-02-01 ENCOUNTER — Encounter: Payer: Self-pay | Admitting: Otolaryngology

## 2021-02-01 VITALS — BP 140/90 | Temp 99.5°F | Ht 67.0 in | Wt 260.6 lb

## 2021-02-01 DIAGNOSIS — H65491 Other chronic nonsuppurative otitis media, right ear: Secondary | ICD-10-CM

## 2021-02-01 DIAGNOSIS — H6983 Other specified disorders of Eustachian tube, bilateral: Secondary | ICD-10-CM

## 2021-02-01 MED ORDER — CIPROFLOXACIN-DEXAMETHASONE 0.3-0.1 % OT SUSP *I*
4.0000 [drp] | Freq: Two times a day (BID) | OTIC | 1 refills | Status: DC
Start: 2021-02-01 — End: 2021-02-03

## 2021-02-01 NOTE — Progress Notes (Signed)
Jaime Watts was referred by Dr. Paulina Fusi.    Subjective  Chief Complaint: She presents today for ear follow-up.    HPI: This is a 50 y.o. old female, who has a long history of bilateral eustachian tube dysfunction as detailed in previous note from 08/30/2020.  At that visit we placed tubes in both ears.  There was a thick effusion on the right and just some retraction on the left.  Comes in today for concerns of an abnormal appearance of the right tube.  No drainage or pain.  She thinks her hearing is at baseline.    Outpatient Medications Marked as Taking for the 02/01/21 encounter (Office Visit) with Donella Stade, MD   Medication Sig Dispense Refill    albuterol HFA (PROVENTIL, VENTOLIN, PROAIR HFA) 108 (90 Base) MCG/ACT inhaler SMARTSIG:1 Puff(s) Via Inhaler Every 6 Hours PRN      RA VITAMIN D-3 25 MCG (1000 UT) tablet Take 1,000 units by mouth daily      valACYclovir (VALTREX) 1 gm tablet Take 1 tablet by mouth daily      hydroCHLOROthiazide (MICROZIDE) 12.5 MG capsule Take 12.5 mg by mouth every morning      loratadine (CLARITIN) 10 MG tablet Take 10 mg by mouth daily      fluticasone (FLONASE) 50 MCG/ACT nasal spray 1 spray by Nasal route daily      meloxicam (MOBIC) 15 MG tablet Take 15 mg by mouth daily      buPROPion (WELLBUTRIN XL) 150 MG 24 hr tablet take 1 tablet by mouth once daily      traMADol (ULTRAM) 50 MG tablet 1 tab tid prn      diclofenac (VOLTAREN) 1 % gel Apply 4 g to affected area 4 times daily. 1 Tube 2    ondansetron (ZOFRAN-ODT) 4 MG disintegrating tablet Take 4 mg by mouth 2 times daily as needed    0    levothyroxine (SYNTHROID, LEVOTHROID) 50 MCG tablet Take 50 mcg by mouth daily         Medication allergies: Lisinopril and Shellfish-derived products    Problem List: has Acute mucoid otitis media of left ear; Dyspnea, unspecified type; and HTN (hypertension) on their problem list.    Medical History:   Past Medical History:   Diagnosis Date    Depression         Surgical History:   Past Surgical History:   Procedure Laterality Date    CESAREAN SECTION, CLASSIC      l4 l5 fusion      laproscopy      TYMPANOSTOMY TUBE PLACEMENT      uterine ablation          Family History: family history is not on file.    Social History:   Social History     Tobacco Use    Smoking status: Current Every Day Smoker     Packs/day: 0.50     Types: Cigarettes    Smokeless tobacco: Never Used   Substance Use Topics    Alcohol use: No       ROS: ENT ROS: as above    Objective  Vitals:    02/01/21 1016   BP: 140/90   Temp: 37.5 C (99.5 F)   Weight: 118.2 kg (260 lb 9.6 oz)   Height: 1.702 m (5\' 7" )       Exam  Constitution:  Appears well developed and well nourished. No signs of acute distress present. Patient is cooperative and overall  behavior is appropriate.    Head / Face: Atraumatic, normocephalic on inspection.    Eyes:  Conjunctivae clear. No periorbital edema of the upper and lower lids. Sclerae clear.    Ears: Inspection reveals no lesion of the ears, swelling of the ears, or tenderness of the ears.  No discharge, mass or stenosis in the auditory canals.  There is some medial cerumen on both sides which is removed under the operating microscope with a cerumen loop.    TM's -tubes are in place and are patent bilaterally.  After cleaning, there is a very small amount of adherent mucoid drainage around the right tube.    Assessment    ICD-10-CM ICD-9-CM    1. COME (chronic otitis media with effusion), right  H65.491 381.3    2. ETD (Eustachian tube dysfunction), bilateral  H69.83 381.81         Plan  Both tubes look fine.  I think that cerumen on the right might have given an abnormal appearance.  At the clean there is a small amount of mucoid drainage for which she will use Ciprodex for the next 3 days.  Probably a good idea for her to have some on hand anyway in case she gets otorrhea.  She is very well experience with PE tubes and has reliable symptoms so I am comfortable  with her seeing [place] as needed.    Seen by : Donella Stade, MD 02/01/2021

## 2021-02-03 ENCOUNTER — Telehealth: Payer: Self-pay

## 2021-02-03 MED ORDER — OFLOXACIN 0.3 % OT SOLN *I*
5.0000 [drp] | Freq: Two times a day (BID) | OTIC | 5 refills | Status: DC
Start: 2021-02-03 — End: 2022-08-06

## 2021-02-03 NOTE — Telephone Encounter (Signed)
Dr. Fredrik Cove- [name] was seen on 02/01/21 and you sent in script for Ciprodex, her insurance will NOT cover and she can not afford 200.00, is there something else she can use? Script was sent to [facility] [place]. [name] can be reached @ [phone].

## 2021-02-03 NOTE — Telephone Encounter (Signed)
[name] notified Floxin sent to pharmacy

## 2021-02-03 NOTE — Telephone Encounter (Signed)
I sent her in some Floxin.

## 2021-02-03 NOTE — Addendum Note (Signed)
Addended by: Mercy Moore on: 02/03/2021 03:01 PM     Modules accepted: Orders

## 2022-07-16 ENCOUNTER — Ambulatory Visit: Payer: PRIVATE HEALTH INSURANCE | Admitting: Otolaryngology

## 2022-07-16 ENCOUNTER — Other Ambulatory Visit: Payer: Self-pay

## 2022-07-16 ENCOUNTER — Encounter: Payer: Self-pay | Admitting: Otolaryngology

## 2022-07-16 VITALS — BP 140/92 | Temp 98.3°F | Ht 67.0 in | Wt 275.0 lb

## 2022-07-16 DIAGNOSIS — H65491 Other chronic nonsuppurative otitis media, right ear: Secondary | ICD-10-CM

## 2022-07-16 DIAGNOSIS — R1031 Right lower quadrant pain: Secondary | ICD-10-CM | POA: Insufficient documentation

## 2022-07-16 NOTE — Progress Notes (Signed)
Jaime Watts is a 52 y.o. female with a PMH of anxiety, HTN, and thyroid disorder, who presents today for further assessment of NAFLD, RLQ pain, and diarrhea.     Jaime Watts presented to [facility] 12/17 after awaking with RUQ pain, one episode of diarrhea with blood, followed by large quantity bleeding. She endorses having a CT A/P completed there, which we do not have a copy of.     Her last colonoscopy was completed in 2019 in which a one year repeat was recommended due to awaking during her procedure (report below).     Today, Jaime Watts endorses being told she had a mass on her liver after her CT (however, she states this was not discussed while in the ED). She subsequently underwent an abdominal US which was unrevealing for any liver mass (report below). She was found to have diffuse fatty infiltration.     She denies any current diarrhea or bleeding. She continues with RUQ pain without any particular triggers.     I have reviewed all patient allergies and medications, and updated all relevant portions of the past medical/surgical/social and family history as below.    ALLERGIES:  Allergies   Allergen Reactions   . Fish Containing Products Unknown     unknown       MEDICATIONS:  Outpatient Medications Marked as Taking for the 07/16/22 encounter (Office Visit) with Ileene Musa, NP   Medication Sig Dispense Refill   . albuterol (PROVENTIL HFA) 90 mcg/actuation Inhl inhaler Inhale 2 puffs into the lungs every 6 (six) hours as needed for Wheezing or Shortness of Breath. 1 each 1   . baclofen 10 MG Oral tablet Take 1 tablet by mouth 3 (three) times daily as needed for Muscle spasms. 30 tablet 1   . blood pressure monitor Misc Kit 1 each by Misc.(Non-Drug; Combo Route) route daily. 1 each 0   . cholecalciferol, Vitamin D3, 25 mcg (1000 unit) Oral Tab take 1 tablet by mouth once daily  Indications: low vitamin D levels 90 tablet 3   . DULoxetine 60 MG Oral DR capsule Take 1 capsule by  mouth daily. 90 capsule 1   . HYDROCHLOROTHIAZIDE 12.5 mg Oral capsule take 1 capsule by mouth every morning 30 capsule 5   . IBUPROFEN 800 MG Oral tablet take 1 tablet by mouth every 8 hours AS NEEDED FOR PAIN 60 tablet 1   . LEVOTHYROXINE 75 MCG Oral tablet take 1 tablet by mouth once daily 90 tablet 1   . meclizine 12.5 mg Oral tablet Take 1 tablet by mouth 3 (three) times daily as needed for Dizziness. 30 tablet 1   . traMADoL 50 mg Oral tablet Take 1 tablet by mouth every 8 (eight) hours as needed for Pain. Max Daily Amount: 150 mg. Take 1 tab po tid as needed  Indications: pain 20 tablet 0   . VALACYCLOVIR 1000 MG Oral tablet take 1 tablet by mouth once daily 30 tablet 1       SOCIAL HISTORY:  Social History     Socioeconomic History   . Marital status: Divorced     Spouse name: Not on file   . Number of children: 1   . Years of education: Not on file   . Highest education level: Not on file   Occupational History   . 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 Smoking status: Former  Years: 47     Types: Cigarettes     Quit date: 08/17/2021     Years since quitting: 0.9   . Smokeless tobacco: Never   Substance and Sexual Activity   . Alcohol use: No   . Drug use: No   . Sexual activity: Not on file   Other Topics Concern   . Not on file   Social History Narrative    Lives with a friend. Son, Riki Rusk- married 12/07/17.  First grandson born May 2020         Social Determinants of Health     Financial Resource Strain: High Risk (05/04/2022)    Overall Financial Resource Strain (CARDIA)    . Difficulty of Paying Living Expenses: Hard   Food Insecurity: Food Insecurity Present (05/04/2022)    Hunger Vital Sign    . Worried About Programme researcher, broadcasting/film/video in the Last Year: Sometimes true    . Ran Out of Food in the Last Year: Sometimes true   Transportation Needs: No Transportation Needs (05/04/2022)    PRAPARE - Transportation    . Lack of Transportation (Medical): No    . Lack of  Transportation (Non-Medical): No   Physical Activity: Inactive (05/04/2022)    Exercise Vital Sign    . Days of Exercise per Week: 0 days    . Minutes of Exercise per Session: 0 min   Stress: Stress Concern Present (05/04/2022)    [employer] of Occupational Health - Occupational Stress Questionnaire    . Feeling of Stress : To some extent   Social Connections: Socially Isolated (05/04/2022)    Social Connection and Isolation Panel [NHANES]    . Frequency of Communication with Friends and Family: Twice a week    . Frequency of Social Gatherings with Friends and Family: Once a week    . Attends Religious Services: Never    . Active Member of Clubs or Organizations: No    . Attends Banker Meetings: Never    . Marital Status: Divorced   Intimate Partner Violence: Not on file   Housing Stability: Low Risk  (05/04/2022)    Housing Stability Vital Sign    . Unable to Pay for Housing in the Last Year: No    . Number of Places Lived in the Last Year: 1    . Unstable Housing in the Last Year: No       PAST MEDICAL HISTORY:  Past Medical History:   Diagnosis Date   . Acquired hypothyroidism 09/23/2017   . Anxiety 11/01/2018   . Elevated blood pressure reading 09/23/2017   . Hypertension, unspecified type 08/13/2019   . Tobacco use 08/13/2019       PAST SURGICAL HISTORY:  Past Surgical History:   Procedure Laterality Date   . COLONOSCOPY  01/30/2018    repeat in 1 year       FAMILY HISTORY:  Family History   Problem Relation Name Age of Onset   . Alzheimer's disease Mother     . Cancer Mother     . Hypertension Mother     . Diabetes Father     . Alzheimer's disease Father     . Bladder cancer Sister     . Stroke Brother     . Diabetes Brother         REVIEW OF SYSTEMS:  Review of Systems   Constitutional: Negative.  Negative for weight loss.   Respiratory: Negative.  Negative for shortness of  breath.    Cardiovascular: Negative.  Negative for chest pain.   Gastrointestinal: Positive for abdominal pain. Negative  for blood in stool, constipation, diarrhea, heartburn, melena, nausea and vomiting.   Genitourinary: Negative.        PHYSICAL EXAM:    Visit Vitals  BP (!) 150/89 (BP Location: Right arm, Patient Position: Sitting)   Pulse (!) 106       Physical Exam   Constitutional: She is oriented to person, place, and time. She appears well-developed.   HENT:   Head: Normocephalic.   Eyes: Conjunctivae and EOM are normal.   Pulmonary/Chest: Effort normal.   Abdominal: Soft. She exhibits no distension and no mass. There is abdominal tenderness. There is no guarding.   Musculoskeletal: Normal range of motion.   Neurological: She is alert and oriented to person, place, and time.   Skin: Skin is warm and dry.   Psychiatric: She has a normal mood and affect. Her behavior is normal. Judgment and thought content normal.          OBJECTIVE DATA:    Labs:  Lab Results   Component Value Date    HCT 44.2 07/03/2022    MCV 91 07/03/2022     Lab Results   Component Value Date    CA 8.9 07/03/2022    ALB 3.7 07/03/2022    NA 135 (L) 07/03/2022    CL 100 07/03/2022    ALK 92 07/03/2022    ALT 19 07/03/2022    AST 27.0 07/03/2022    TBIL 0.3 07/03/2022     No results found for: "OCBL1", "OCBL2", "OCBL3"    Imaging:   Abdominal US 07/03/22:  FINDINGS:  The inferior vena cava at the level of the liver is normal. The aorta is  non-aneurysmal. The liver has increased echogenicity consistent with diffuse fatty  infiltration and is enlarged measuring 20.2 cm in length.  The common bile duct diameter  is 3 mm.  The gallbladder is normal without stones or wall thickening.  The pancreas is  poorly visualized.  Right kidney measures 11.7 cm in length without focal lesion.       IMPRESSION:  1.  NO GALLSTONES.   2.  MILDLY ENLARGED LIVER WITH FATTY INFILTRATION.     Procedures:  Colonoscopy 01/30/2018:  PATIENT: SHAWNAE SADOWSKI MR #: [id number]   PCP: Ruben Reason, MD DOB: [birth year]   DICTATED BY: Dionne Ano, MD PROCEDURE DATE: 01/30/2018      Screening Colonoscopy    PCP: Ruben Reason, MD     The patient denies changes in her bowels or blood in her stools. The patient was examined prior to the procedure.    The cardiac exam revealed normal S1/S2 without S3 or murmurs.  The pulmonary exam revealed normal breath sounds.  The patient was deemed an ASA Class 2.  The patient was examined prior to the procedure, and her questions were answered to her satisfaction. The patient elected to undergo a colonoscopy.    Medicines Given During the Procedure:   Medication Administration - last 24 hours from 01/30/2018 1202 to 01/30/2018 1210   Date/Time Order Dose Route Action Action by   01/30/2018 1208 fentaNYL (SUBLIMAZE) 50 mcg/mL injection 100 mcg Intravenous Given Dionne Ano, MD   01/30/2018 1209 midazolam (VERSED) 5 mg/mL injection 5 mg Intravenous Given Dionne Ano, MD       The Olympus variable tension colonoscope was advanced to the right transverse colon with  moderate difficulty. The scope was withdrawn through the entire colon. The prep was good. A J-maneuver was performed in the rectum and there were small internal hemorrhoids. Random biopsies were obtained    Colonoscopy to the right transverse without polyps, no inflammatory mucosal changes and no diverticula.    I recommend a follow-up colonoscopy in 1 years' time. The patient was instructed to call if they have any new problems, i.e. diarrhea, constipation, lower abdominal cramps, changes associated with hematochezia, or if the patient is hemoccult positive. In that case, a colonoscopy may need to be performed again. The colonoscopy was incomplete due to inadequate sedation. We will treat for IBS with fiber/probiotics and Levisen SL. If she is still having problems, a colonoscopy with anesthesia may be indicated. She should have anesthesia with all future colonoscopies    ASSESSMENT AND RECOMMENDATIONS:    This is a pleasant 52 y.o. female who presents today in further assessment  of hematochezia. Our recommendations at this time include the following:    Hematochezia/diarrhea/RUQ pain:  -Diarrhea occurred once with large volume bleeding for six ours   -Tenderness on exam to RUQ pain  -Call placed [facility], with pending fax of her CT  -Due to history of incomplete colonoscopy due to poor sedation, discussed repeating. She is agreeable.  -Colonoscopy with MAC    NAFLD:  -[place] reviewed, no lesions  -Discussed risk factors for NAFLD  -Will plan on FU in six months for this    We will see Jaime Watts at time of her colonoscopy, and again in six months, pending continued work up/CT for RUQ pain

## 2022-07-16 NOTE — Progress Notes (Signed)
Jaime Watts was referred by Dr. Paulina Fusi.    Subjective  Chief Complaint: She presents today for ear recheck.    HPI: This is a 52 y.o. old female, who has a long history of bilateral eustachian tube dysfunction.  At her last visit on 02/01/2021 she had tubes in place which were patent in both ears.  She thinks both tubes are out.  Hearing seems to be decreased.  She did have some recent drainage on the right that seems to be resolved.    Outpatient Medications Marked as Taking for the 07/16/22 encounter (Office Visit) with Donella Stade, MD   Medication Sig Dispense Refill    CYMBALTA 60 MG DR capsule       aspirin 81 mg EC tablet Take 1 tablet (81 mg total) by mouth daily      RA VITAMIN D-3 25 MCG (1000 UT) tablet Take 1 tablet (1,000 units total) by mouth daily      valACYclovir (VALTREX) 1 gm tablet Take 1 tablet (1,000 mg total) by mouth daily      hydroCHLOROthiazide (MICROZIDE) 12.5 MG capsule Take 1 capsule (12.5 mg total) by mouth every morning      loratadine (CLARITIN) 10 MG tablet Take 1 tablet (10 mg total) by mouth daily      traMADol (ULTRAM) 50 MG tablet 1 tab tid prn      levothyroxine (SYNTHROID, LEVOTHROID) 50 MCG tablet Take 1 tablet (50 mcg total) by mouth daily         Medication allergies: Lisinopril and Shellfish-derived products    Problem List: has Acute mucoid otitis media of left ear; Dyspnea, unspecified type; and HTN (hypertension) on their problem list.    Medical History:   Past Medical History:   Diagnosis Date    Depression     Hypertension        Surgical History:   Past Surgical History:   Procedure Laterality Date    CESAREAN SECTION, CLASSIC      l4 l5 fusion      laproscopy      TYMPANOSTOMY TUBE PLACEMENT      uterine ablation          Family History: family history is not on file.    Social History:   Social History     Tobacco Use    Smoking status: Former     Packs/day: .5     Types: Cigarettes     Passive exposure: Past    Smokeless tobacco: Never   Substance  Use Topics    Alcohol use: No       ROS: ENT ROS: as above    Objective  Vitals:    07/16/22 1538   BP: (!) 140/92   Temp: 36.8 C (98.3 F)   Weight: 124.7 kg (275 lb)   Height: 1.702 m (5\' 7" )       Exam  Constitution:  Appears well developed and well nourished. No signs of acute distress present. Patient is cooperative and overall behavior is appropriate.    Head / Face: Atraumatic, normocephalic on inspection.    Eyes:  Conjunctivae clear. No periorbital edema of the upper and lower lids. Sclerae clear.    Ears: Inspection reveals no lesion of the ears, swelling of the ears, or tenderness of the ears.  No discharge, mass or stenosis in the auditory canals.     TM's -she has retraction of the left tympanic membrane but no obvious fluid.  On the right  there is some debris on the tympanic membrane especially superiorly and posteriorly.  Additionally, there is some posterior marginal granulation tissue.    Nose: External Nose: exhibits no deformity or lesions. Internal Nose: No discharge from the nasal mucosae, no edema, no erythema of the nasal mucosae. Nasal Septum: not significantly deviated or perforated. Nasal turbinates are normal in color and size.    Oral cavity: Lips appear normal and healthy. Dentition is normal for age. Gums appear healthy. Tongue shows a smooth surface and symmetry. Floor of the mouth appears normal. Salivary glands normal in size with no asymmetry. Submandibular and parotid ducts are patent bilaterally. Oral mucosa moist with no thrush and no mucositis. Hard palate normal in appearance.    Oropharynx: No lesions or masses. Soft palate normal in appearance. Uvula midline and normal in size. Tonsils appear normal. Posterior pharyngeal mucosa appears normal.    Neck: Symmetric. Palpation reveals no swelling or tenderness. No masses appreciated. Trachea is midline and has good landmarks. Thyroid exhibits no palpable enlargement, nodules or tenderness on palpation.    Neurological: Alert and  oriented x 3.    Psychological: Mood is normal. Patient's affect is appropriate to mood. Speech is spontaneous with regular rate, rhythm, and volume.    MYRINGOTOMY AND TUBE  The left ear is examined under the microscope. Effusion confirmed.  Topical phenol used for anesthesia on the TM inferiorly.  Myringotomy made and suctioning done.  No fluid..  Tube placed without difficulty.  Patient tolerated the procedure well.  Tube type: Straight Vent [employer].  Lot #: [id number].  Exp. 2027-04-09.      Assessment    ICD-10-CM ICD-9-CM    1. COME (chronic otitis media with effusion), right  H65.491 381.3            Plan  We discussed options.  She like a tube in the left ear.  Tube placed as above.  No effusion.  On the right, it looks like her tube probably recently extruded and she has some inflammatory changes.  That would also explain the otorrhea.  She will use Ciprodex drops 4 drops to the right ear twice daily for 7 days.  We will see her again in about 3 weeks.      Left PE tube discussed   The nature of the procedure, risks, benefits and alternatives (including no treatment) were discussed with the patient / caregiver / family.    All questions were answered and they wish to proceed.  The risks include but are not limited to bleeding, infection, reaction to anesthesia, failure to relieve symptoms, the need for further surgical or medical treatment, early or late tube extrusion, TM perforation, cyst formation, drainage from ears.     Seen by : Donella Stade, MD 07/16/2022

## 2022-08-03 NOTE — Telephone Encounter (Signed)
Patient is aware 

## 2022-08-06 ENCOUNTER — Ambulatory Visit: Payer: PRIVATE HEALTH INSURANCE | Admitting: Otolaryngology

## 2022-08-06 ENCOUNTER — Encounter: Payer: Self-pay | Admitting: Otolaryngology

## 2022-08-06 ENCOUNTER — Telehealth: Payer: Self-pay

## 2022-08-06 ENCOUNTER — Other Ambulatory Visit: Payer: Self-pay

## 2022-08-06 VITALS — BP 134/70 | Temp 97.8°F | Ht 67.0 in | Wt 270.0 lb

## 2022-08-06 DIAGNOSIS — H9212 Otorrhea, left ear: Secondary | ICD-10-CM

## 2022-08-06 DIAGNOSIS — H6993 Unspecified Eustachian tube disorder, bilateral: Secondary | ICD-10-CM

## 2022-08-06 MED ORDER — CIPROFLOXACIN-DEXAMETHASONE 0.3-0.1 % OT SUSP *I*
4.0000 [drp] | Freq: Two times a day (BID) | OTIC | 1 refills | Status: DC
Start: 2022-08-06 — End: 2022-08-06

## 2022-08-06 MED ORDER — OFLOXACIN 0.3 % OT SOLN *I*
4.0000 [drp] | Freq: Two times a day (BID) | OTIC | 5 refills | Status: AC
Start: 2022-08-06 — End: ?

## 2022-08-06 NOTE — Telephone Encounter (Signed)
Notified the patient that a new script was sent to the pharmacy.

## 2022-08-06 NOTE — Telephone Encounter (Signed)
I think this happened before but there was nothing in the chart.  I am going to change it to ofloxacin.  Prescription sent.

## 2022-08-06 NOTE — Progress Notes (Signed)
Jaime Watts was referred by Dr. Paulina Fusi.    Subjective  Chief Complaint: She presents today for ear recheck.    HPI: This is a 52 y.o. old female, who comes in for recheck of the ears.  Long history of bilateral eustachian tube dysfunction.  At the last visit she had some retraction of the left tympanic membrane without fluid.  There was some granulation and debris on the right consistent with a recently extruded tube.  We placed a PE tube on the left and started Ciprodex drops on the right.  She reports that the right ear no longer seems to be draining.  On the left, she started draining the day after we put the tube in.        Outpatient Medications Marked as Taking for the 08/06/22 encounter (Office Visit) with Donella Stade, MD   Medication Sig Dispense Refill    CYMBALTA 60 MG DR capsule       aspirin 81 mg EC tablet Take 1 tablet (81 mg total) by mouth daily      albuterol HFA (PROVENTIL, VENTOLIN, PROAIR HFA) 108 (90 Base) MCG/ACT inhaler SMARTSIG:1 Puff(s) Via Inhaler Every 6 Hours PRN      RA VITAMIN D-3 25 MCG (1000 UT) tablet Take 1 tablet (1,000 units total) by mouth daily      valACYclovir (VALTREX) 1 gm tablet Take 1 tablet (1,000 mg total) by mouth daily      hydroCHLOROthiazide (MICROZIDE) 12.5 MG capsule Take 1 capsule (12.5 mg total) by mouth every morning      loratadine (CLARITIN) 10 MG tablet Take 1 tablet (10 mg total) by mouth daily      fluticasone (FLONASE) 50 MCG/ACT nasal spray Spray 1 spray into nostril daily      traMADol (ULTRAM) 50 MG tablet 1 tab tid prn      levothyroxine (SYNTHROID, LEVOTHROID) 50 MCG tablet Take 1 tablet (50 mcg total) by mouth daily         Medication allergies: Lisinopril and Shellfish-derived products    Problem List: has Acute mucoid otitis media of left ear; Dyspnea, unspecified type; and HTN (hypertension) on their problem list.    Medical History:   Past Medical History:   Diagnosis Date    Depression     Hypertension        Surgical History:    Past Surgical History:   Procedure Laterality Date    CESAREAN SECTION, CLASSIC      l4 l5 fusion      laproscopy      TYMPANOSTOMY TUBE PLACEMENT      uterine ablation          Family History: family history is not on file.    Social History:   Social History     Tobacco Use    Smoking status: Former     Packs/day: .5     Types: Cigarettes     Passive exposure: Past    Smokeless tobacco: Never   Substance Use Topics    Alcohol use: No       ROS: ENT ROS: as above    Objective  Vitals:    08/06/22 0845   BP: 134/70   Temp: 36.6 C (97.8 F)   Weight: 122.5 kg (270 lb)   Height: 1.702 m (5\' 7" )       Exam  Constitution:  Appears well developed and well nourished. No signs of acute distress present. Patient is cooperative and overall behavior is  appropriate.    Head / Face: Atraumatic, normocephalic on inspection.    Eyes:  Conjunctivae clear. No periorbital edema of the upper and lower lids. Sclerae clear.    Ears: Inspection reveals no lesion of the ears, swelling of the ears, or tenderness of the ears.  No discharge, mass or stenosis in the auditory canals.     TM's -the left tube is in place.  There is some whitish mucoid drainage which is suctioned cleaned.  Mild edema of the tympanic membrane.  On the right the granulation is no longer present.  A small amount of adherent debris is suctioned cleaned from the anterior superior tympanic membrane    Assessment    ICD-10-CM ICD-9-CM    1. ETD (Eustachian tube dysfunction), bilateral  H69.93 381.81       2. Purulent drainage from left ear through ear tube  H92.12 388.60            Plan  She will treat that left ear with Ciprodex.  The right ear seems improved.  I will send her a message next Monday to see how things are going.  She will call me sooner if needed.    Seen by : Donella Stade, MD 08/06/2022

## 2022-08-06 NOTE — Telephone Encounter (Signed)
Patient went to pick up her ear drops and her copay was >$200 - is there another option.

## 2022-08-06 NOTE — Addendum Note (Signed)
Addended by: Mercy Moore on: 08/06/2022 10:42 AM     Modules accepted: Orders

## 2022-08-16 ENCOUNTER — Encounter: Payer: Self-pay | Admitting: Otolaryngology

## 2022-10-01 ENCOUNTER — Encounter: Payer: Self-pay | Admitting: Gastroenterology

## 2022-10-08 ENCOUNTER — Ambulatory Visit: Payer: PRIVATE HEALTH INSURANCE | Admitting: Student in an Organized Health Care Education/Training Program

## 2022-10-10 ENCOUNTER — Encounter: Payer: Self-pay | Admitting: Gastroenterology

## 2022-11-12 NOTE — Progress Notes (Signed)
[facility] UROLOGY   [facility] [place]  11/12/2022      Subjective     History of Present Illness:   This patient is a 52 y.o. female with PMH including  has a past medical history of Depression and Hypertension.     Patient presents today with below issues:    Hematuria    No imaging studies  Last CMP was August 28, 2020 with a BUN of 9, creatinine 0.9 and a GFR of 66    When: 1 st time a little 12/23, 2/ 24 a lot  urinalysis was negative for infection  How long: a little hematuria patient reports pink urine   Gross versus microscopic: gross  Accompaning symptoms: nothing  Smoker: endorses  Anticoagulation?: denies  Exposure to chemicals: endorses exposed to [employer] of GU cancers: endorses sister has bladder cancer, she passed away from bladder cancer  Kidney stones: denies  Infectious symptoms: denies  Irritative voiding symptoms: endorses some dysuria with urination  Obstructive voiding symptoms: denies  STIs: denies  Prior GU surgery/instrumentation: endorses  C-section horizontal     Reports RUQ pain   Constant ache  With intermittent sharp stabbing pain    Review of System:  ROS-Urology    Current Outpatient Medications   Medication Sig    Benefiber POWD Take by mouth    medroxyPROGESTERone (PROVERA) 2.5 mg tablet Take 1 tablet (2.5 mg total) by mouth daily    meclizine (ANTIVERT) 12.5 mg tablet Take 1 tablet (12.5 mg total) by mouth 3 times daily as needed    Probiotic, Lactobacillus, CAPS Take by mouth    irbesartan (AVAPRO) 75 mg tablet Take 1 tablet (75 mg total) by mouth    ibuprofen (ADVIL,MOTRIN) 800 mg tablet Take 1 tablet (800 mg total) by mouth every 8 hours as needed    hyoscyamine (ANASPAZ,LEVSIN) 0.125 mg tablet Take 1 tablet (0.125 mg total) by mouth every 8 hours as needed    estradiol (VIVELLE-DOT) 0.025 MG/24HR patch Apply 1 patch onto the skin every 2 weeks    dicyclomine (BENTYL) 10 mg capsule Take 1 capsule (10 mg total) by mouth 2 times daily    buPROPion (WELLBUTRIN XL) 300 mg 24  hr tablet Take 1 tablet (300 mg total) by mouth    baclofen (LIORESAL) 10 mg tablet Take 1 tablet (10 mg total) by mouth 3 times daily as needed    ofloxacin (FLOXIN) 0.3 % otic solution Place 4 drops into the left ear 2 times daily  for Acute Infection of the Middle Ear    CYMBALTA 60 MG DR capsule     aspirin 81 mg EC tablet Take 1 tablet (81 mg total) by mouth daily    albuterol HFA (PROVENTIL, VENTOLIN, PROAIR HFA) 108 (90 Base) MCG/ACT inhaler SMARTSIG:1 Puff(s) Via Inhaler Every 6 Hours PRN    RA VITAMIN D-3 25 MCG (1000 UT) tablet Take 1 tablet (1,000 units total) by mouth daily    valACYclovir (VALTREX) 1 gm tablet Take 1 tablet (1,000 mg total) by mouth daily    hydroCHLOROthiazide (MICROZIDE) 12.5 MG capsule Take 1 capsule (12.5 mg total) by mouth every morning    loratadine (CLARITIN) 10 MG tablet Take 1 tablet (10 mg total) by mouth daily    fluticasone (FLONASE) 50 MCG/ACT nasal spray Spray 1 spray into nostril daily    traMADol (ULTRAM) 50 MG tablet 1 tab tid prn    levothyroxine (SYNTHROID, LEVOTHROID) 50 MCG tablet Take 1 tablet (50  mcg total) by mouth daily       Past Medical History:   Diagnosis Date    Depression     Hypertension        Past Surgical History:   Procedure Laterality Date    CESAREAN SECTION, CLASSIC      l4 l5 fusion      laproscopy      TYMPANOSTOMY TUBE PLACEMENT      uterine ablation         Allergies   Allergen Reactions    Lisinopril Cough    Shellfish-Derived Products Hives     Hives   All fish products           Objective     Physical Exam:  Vitals:    11/13/22 1119   BP: (!) 157/112   Pulse: 93   Temp: 36.3 C (97.4 F)   Weight: 122.9 kg (271 lb)   Height: 1.702 m (5\' 7" )       Performance status: ECOG: 0 - Asymptomatic     Mental status: Normal Mental State, Pleasant patient   Constitutional: Normal grooming and hygiene, no apparent distress   HEENT: No neck masses identified   Respiratory: Unlabored breathing, normal chest excursion, no use of accessory muscles of  respiration   Cardiac: No pedal edema    Abdomen:  Soft, non-distended, right upper quadrant tenderness  Surgica Incisions: Horizontal C-section   Genitalia: Not examined.     Extremities: No pitting edema   Musculoskeletal: Normal range of motion, good strength and tone   Neurologic: Cranial nerves grossly intact, no focal weakness or spasticity   Dermatological: Absence of rashes        Labs:  No results for input(s): "NA", "K", "CL", "CO2", "UN", "CREAT", "GFRC", "GFRB", "GLU", "CA" in the last 8760 hours.    No results for input(s): "WBC", "HGB", "HCT", "RBC", "PLT" in the last 8760 hours.    No results found for: "PSAR", "PSAR3"  No results for input(s): "UAPP", "UCOL", "UAGLU", "KETONESU", "USG", "UBLD", "UAPH", "UPRO", "UNITR", "ULEU", "URBC", "UWBC", "UMUC", "GLUCOSEUA", "KETONESUA", "SPECIFICGRAV", "BLOODUA", "PHUA", "PROTEIN", "NITRITEUA", "LEUKESTERASE", "GLUCOSESIE", "KETONESIEM", "PUSG", "BLOODSIEMEN", "PUAPH", "PROTEINUA", "PUNIT", "LEUKOCYTESIE" in the last 8760 hours.            I have reviewed this patient's lab work.    Imaging:  No images are attached to the encounter.      I have reviewed any available imaging   Discussion: This is a 52 year old white female the presented to the office for establishment of care.  Patient has had gross hematuria starting in January and then happening again in March 2024.  She denies blood clots but urine is pink-tinged.  Patient complains of right upper quadrant pain that she describes as a constant ache with intermittent sharp stabbing pains.  She does not have any CVA tenderness or flank tenderness.  Patient does have a family history significant with her sister having bladder cancer which metastasized and she passed away from.  We will get a CT urogram for evaluation we will also perform a cystoscopy.       Assessment & Plan     Assessment  Hematuria        Plan  CT urogram  Cystoscopy with Dr. Raleigh Callas         All questions were answered and concerns addressed.      Follow up: Cystoscopy with Dr. Lupita Leash, PA 11/12/2022 9:15 AM    Please  call with questions or concerns,    [facility], [place], [place]   Answers submitted by the patient for this visit:  Urology Blood in Urine (Hematuria) Questionnaire (Submitted on 11/06/2022)  Chief Complaint: Hematuria  Date of Onset:  : 05/28/2022  Progression since Onset?  : waxing and waning  Have you seen a urologist?  : No  Have you had any prior abdominal or pelvic surgeries?: prior laparoscopic/endoscopic  Have you ever needed a catheter before?: no  Describe the blood in your urine (if you have any).: red  Are you on anticoagulation/blood thinners?: no  Smoking history?  : yes  What is your goal for your visit? : Find cause of pain, pain varies between 3-10  How would you rate your pain level? (0 = no pain; 10 = worst pain): 4/10  How would you describe your pain?: constant, intermittent, sharp, stabbing  What prior testing have you had for this problem?: CT scan, imaging, labs, ultrasound, urine tests  What treatments have you tried for this problem?: a change of diet, activity modification, adequate fluid intake, antibiotics, medications  Was there any improvement after the treatment you tried?  : mild

## 2022-11-13 ENCOUNTER — Other Ambulatory Visit: Payer: Self-pay

## 2022-11-13 ENCOUNTER — Ambulatory Visit: Payer: PRIVATE HEALTH INSURANCE | Attending: Urology | Admitting: Urology

## 2022-11-13 ENCOUNTER — Encounter: Payer: Self-pay | Admitting: Urology

## 2022-11-13 VITALS — BP 157/112 | HR 93 | Temp 97.4°F | Ht 67.0 in | Wt 271.0 lb

## 2022-11-13 DIAGNOSIS — R319 Hematuria, unspecified: Secondary | ICD-10-CM | POA: Insufficient documentation

## 2022-11-13 LAB — POCT CLINITEK URINALYSIS
Bilirubin, Clinitek POCT UA: NEGATIVE
Blood, Clinitek POCT UA: NEGATIVE
Glucose, Clinitek POCT UA: NEGATIVE mg/dL
Ketone, Clinitek POCT UA: NEGATIVE mg/dL
Leukocytes, Clinitek POCT UA: NEGATIVE
Lot #: 308084
Nitrite, Clinitek POCT UA: NEGATIVE
Protein, Clinitek POCT UA: 30 mg/dL — AB
Specific Gravity, Clinitek POCT UA: 1.025 (ref 1.001–1.035)
Urobilinogen, Clinitek POCT UA: 0.2 E.u./dL (ref 0.2–1.0)
pH, Clinitek POCT US: 7.5 (ref 4.6–8.0)

## 2022-11-13 LAB — URINALYSIS WITH MICROSCOPIC
Blood,UA: NEGATIVE
Glucose,UA: NEGATIVE
Ketones, UA: NEGATIVE
Leuk Esterase,UA: NEGATIVE
Nitrite,UA: NEGATIVE
RBC,UA: NONE SEEN /hpf (ref 0–2)
Specific Gravity,UA: 1.02 (ref 1.002–1.030)
pH,UA: 7 (ref 5.0–8.0)

## 2022-11-13 LAB — POCT BLADDER SCAN PVR: Residual mL: 0 ml

## 2022-11-26 ENCOUNTER — Ambulatory Visit
Admission: RE | Admit: 2022-11-26 | Discharge: 2022-11-26 | Disposition: A | Discharge status: Home / Self Care | Payer: PRIVATE HEALTH INSURANCE | Source: Ambulatory Visit | Attending: Urology | Admitting: Urology

## 2022-11-26 DIAGNOSIS — K760 Fatty (change of) liver, not elsewhere classified: Secondary | ICD-10-CM

## 2022-11-26 DIAGNOSIS — R590 Localized enlarged lymph nodes: Secondary | ICD-10-CM | POA: Insufficient documentation

## 2022-11-26 DIAGNOSIS — R319 Hematuria, unspecified: Secondary | ICD-10-CM

## 2022-11-26 HISTORY — DX: Essential (primary) hypertension: I10

## 2022-11-26 LAB — BASIC METABOLIC PANEL
Anion Gap: 10 (ref 7–16)
CO2: 31 mmol/L — ABNORMAL HIGH (ref 20–28)
Calcium: 9.4 mg/dL (ref 8.6–10.2)
Chloride: 98 mmol/L (ref 96–108)
Creatinine: 0.86 mg/dL (ref 0.51–0.95)
Glucose: 111 mg/dL — ABNORMAL HIGH (ref 60–99)
Lab: 7 mg/dL (ref 6–20)
Sodium: 139 mmol/L (ref 133–145)
eGFR BY CREAT: 80 *

## 2022-11-26 MED ORDER — IOHEXOL 350 MG/ML (OMNIPAQUE) IV SOLN 500ML BOTTLE *I*
1.0000 mL | Freq: Once | INTRAVENOUS | Status: AC
Start: 2022-11-26 — End: 2022-11-26
  Administered 2022-11-26: 140 mL via INTRAVENOUS

## 2022-11-27 ENCOUNTER — Encounter: Payer: Self-pay | Admitting: Urology

## 2022-11-30 ENCOUNTER — Telehealth: Payer: Self-pay

## 2022-11-30 NOTE — Preop H&P (Signed)
Expand All Collapse All    [facility]   [facility] [place] [facility]  11/12/2022           Subjective[] Expand by Default    History of Present Illness:   This patient is a 52 y.o. female with PMH including  has a past medical history of Depression and Hypertension.      Patient presents today with below issues:     Hematuria     No imaging studies  Last CMP was August 28, 2020 with a BUN of 9, creatinine 0.9 and a GFR of 66     When: 1 st time a little 12/23, 2/ 24 a lot  urinalysis was negative for infection  How long: a little hematuria patient reports pink urine   Gross versus microscopic: gross  Accompaning symptoms: nothing  Smoker: endorses  Anticoagulation?: denies  Exposure to chemicals: endorses exposed to [employer] of GU cancers: endorses sister has bladder cancer, she passed away from bladder cancer  Kidney stones: denies  Infectious symptoms: denies  Irritative voiding symptoms: endorses some dysuria with urination  Obstructive voiding symptoms: denies  STIs: denies  Prior GU surgery/instrumentation: endorses  C-section horizontal      Reports RUQ pain   Constant ache  With intermittent sharp stabbing pain     Review of System:  [facility]-Urology          Current Outpatient Medications   Medication Sig    Benefiber POWD Take by mouth    medroxyPROGESTERone (PROVERA) 2.5 mg tablet Take 1 tablet (2.5 mg total) by mouth daily    meclizine (ANTIVERT) 12.5 mg tablet Take 1 tablet (12.5 mg total) by mouth 3 times daily as needed    Probiotic, Lactobacillus, CAPS Take by mouth    irbesartan (AVAPRO) 75 mg tablet Take 1 tablet (75 mg total) by mouth    ibuprofen (ADVIL,MOTRIN) 800 mg tablet Take 1 tablet (800 mg total) by mouth every 8 hours as needed    hyoscyamine (ANASPAZ,LEVSIN) 0.125 mg tablet Take 1 tablet (0.125 mg total) by mouth every 8 hours as needed    estradiol (VIVELLE-DOT) 0.025 MG/24HR patch Apply 1 patch onto the skin every 2 weeks    dicyclomine (BENTYL) 10 mg capsule Take 1 capsule (10 mg  total) by mouth 2 times daily    buPROPion (WELLBUTRIN XL) 300 mg 24 hr tablet Take 1 tablet (300 mg total) by mouth    baclofen (LIORESAL) 10 mg tablet Take 1 tablet (10 mg total) by mouth 3 times daily as needed    ofloxacin (FLOXIN) 0.3 % otic solution Place 4 drops into the left ear 2 times daily  for Acute Infection of the Middle Ear    CYMBALTA 60 MG DR capsule      aspirin 81 mg EC tablet Take 1 tablet (81 mg total) by mouth daily    albuterol HFA (PROVENTIL, VENTOLIN, PROAIR HFA) 108 (90 Base) MCG/ACT inhaler SMARTSIG:1 Puff(s) Via Inhaler Every 6 Hours PRN    RA VITAMIN D-3 25 MCG (1000 UT) tablet Take 1 tablet (1,000 units total) by mouth daily    valACYclovir (VALTREX) 1 gm tablet Take 1 tablet (1,000 mg total) by mouth daily    hydroCHLOROthiazide (MICROZIDE) 12.5 MG capsule Take 1 capsule (12.5 mg total) by mouth every morning    loratadine (CLARITIN) 10 MG tablet Take 1 tablet (10 mg total) by mouth daily    fluticasone (FLONASE) 50 MCG/ACT nasal spray Spray 1 spray into nostril  daily    traMADol (ULTRAM) 50 MG tablet 1 tab tid prn    levothyroxine (SYNTHROID, LEVOTHROID) 50 MCG tablet Take 1 tablet (50 mcg total) by mouth daily              Past Medical History:   Diagnosis Date    Depression      Hypertension                 Past Surgical History:   Procedure Laterality Date    CESAREAN SECTION, CLASSIC        l4 l5 fusion        laproscopy        TYMPANOSTOMY TUBE PLACEMENT        uterine ablation             Allergies         Allergies   Allergen Reactions    Lisinopril Cough    Shellfish-Derived Products Hives       Hives   All fish products                     Objective  Physical Exam:      Vitals:     11/13/22 1119   BP: (!) 157/112   Pulse: 93   Temp: 36.3 C (97.4 F)   Weight: 122.9 kg (271 lb)   Height: 1.702 m (5\' 7" )         Performance status: ECOG: 0 - Asymptomatic      Mental status: Normal Mental State, Pleasant patient   Constitutional: Normal grooming and hygiene, no apparent  distress   HEENT: No neck masses identified   Respiratory: Unlabored breathing, normal chest excursion, no use of accessory muscles of respiration   Cardiac: No pedal edema    Abdomen:  Soft, non-distended, right upper quadrant tenderness  Surgica Incisions: Horizontal C-section   Genitalia: Not examined.      Extremities: No pitting edema   Musculoskeletal: Normal range of motion, good strength and tone   Neurologic: Cranial nerves grossly intact, no focal weakness or spasticity   Dermatological: Absence of rashes          Labs:  No results for input(s): "NA", "K", "CL", "CO2", "UN", "CREAT", "GFRC", "GFRB", "GLU", "CA" in the last 8760 hours.     No results for input(s): "WBC", "HGB", "HCT", "RBC", "PLT" in the last 8760 hours.     No results found for: "PSAR", "PSAR3"  No results for input(s): "UAPP", "UCOL", "UAGLU", "KETONESU", "USG", "UBLD", "UAPH", "UPRO", "UNITR", "ULEU", "URBC", "UWBC", "UMUC", "GLUCOSEUA", "KETONESUA", "SPECIFICGRAV", "BLOODUA", "PHUA", "PROTEIN", "NITRITEUA", "LEUKESTERASE", "GLUCOSESIE", "KETONESIEM", "PUSG", "BLOODSIEMEN", "PUAPH", "PROTEINUA", "PUNIT", "LEUKOCYTESIE" in the last 8760 hours.                 I have reviewed this patient's lab work.     Imaging:  No images are attached to the encounter.        I have reviewed any available imaging   Discussion: This is a 52 year old white female the presented to the office for establishment of care.  Patient has had gross hematuria starting in January and then happening again in March 2024.  She denies blood clots but urine is pink-tinged.  Patient complains of right upper quadrant pain that she describes as a constant ache with intermittent sharp stabbing pains.  She does not have any CVA tenderness or flank tenderness.  Patient does have a family history significant with her sister having bladder  cancer which metastasized and she passed away from.  We will get a CT urogram for evaluation we will also perform a cystoscopy.               Assessment & Plan  Assessment  Hematuria           Plan  CT urogram  Cystoscopy with Dr. Raleigh Callas        All questions were answered and concerns addressed.      Follow up: Cystoscopy with Dr. Raleigh Callas

## 2022-11-30 NOTE — Telephone Encounter (Signed)
Patient called again today to see if results were in for ct.  She is in a lot of pain.  Did reiterate to her that it takes time to get the results.

## 2022-11-30 NOTE — Invasive Procedure Plan of Care (Signed)
CONSENT FOR MEDICAL  OR SURGICAL PROCEDURE                            Patient Name: Jaime Watts  [facility]                                                              DOB: 1970/09/29         Please read this form or have someone read it to you.   It's important to understand all parts of this form. If something isn't clear, ask [place] to explain.   When you sign it, that means you understand the form and give [place] permission to do this surgery or procedure.     I agree for Nestor Ramp, MD along with any assistants* they may choose, to treat the following condition(s):  hematuria   By doing this surgery or procedure on me: Looking into the bladder using a thin flexible telescope   This is also known as: Flexible cystoscopy   Laterality: Not applicable     *if you'd like a list of the assistants, please ask. We can give that to you.    1. The care provider has explained my condition to me. They have told me how the procedure can help me. They have told me about other ways of treating my condition. I understand the care provider cannot guarantee the result of the procedure. If I don't have this procedure, my other choices are:     2. The care provider has told me the risks (problems that can happen) of the procedure. I understand there may be unwanted results. The risks that are related to this procedure include: Infection, bleeding, urethral injury, bladder injury, ureteral injury, need for catheterization    3. I understand that during the procedure, my care provider may find a condition that we didn't know about before the treatment started. Therefore, I agree that my care provider can perform any other treatment which they think is necessary and available.    4. I give permission to the hospital and/or its departments to examine and keep tissue, blood, body parts, fluids or materials removed from my body during the procedure(s) to aid in diagnosis and treatment, after which they may be used for scientific  research or teaching by appropriate persons. If these materials are used for science or teaching, my identity will be protected. I will no longer own or have any rights to these materials regardless of how they may be used.    5. My care provider might want a representative from a medical device company to be there during my procedure. I understand that person works for:          The ways they might help my care provider during my procedure include:            6. Here are my decisions about receiving blood, blood products, or tissues. I understand my decisions cover the time before, during and after my procedure, my treatment, and my time in the hospital. After my procedure, if my condition changes a lot, my care provider will talk with me again about receiving blood or blood products. At that time, my care provider might need  me to review and sign another consent form, about getting or refusing blood.    I understand that the blood is from the community blood supply. Volunteers donated the blood, the volunteers were screened for health problems. The blood was examined with very sensitive and accurate tests to look for hepatitis, HIV/AIDS, and other diseases. Before I receive blood, it is tested again to make sure it is the correct type.    My chances of getting a sickness from blood products are small. But no transfusion is 100% safe. I understand that my care provider feels the good I will receive from the blood is greater than the chances of something going wrong. My care provider has answered my questions about blood products.      My decision  about blood or  blood products           My decision   about tissue  Implants              I understand this  form.    My care provider  or his/her  assistants have  explained:   What I am having done and why I need it.  What other choices I can make instead of having this done.  The benefits and possible risks (problems) to me of having this done.  The benefits and  possible risks (problems) to me of receiving transplants, blood, or blood products.  There is no guarantee of the results.  The care provider may not stay with me the entire time that I am in the operating or procedure room.  My provider has explained how this may affect my procedure. My provider has answered my  questions about this.         I give my  permission for  this surgery or  procedure.            _______________________________________________                                     My signature  (or parent or other person authorized to sign for you, if you are unable to sign for  yourself or if you are under 57 years old)        ______           Date        _____        Time   Electronic Signatures will display at the bottom of the consent form.    Care provider's statement: I have discussed the planned procedure, including the possibility for transfusion of blood  products or receipt of tissue as necessary; expected benefits; the possible complications and risks; and possible alternatives  and their benefits and risks with the patients or the patient's surrogate. In my opinion, the patient or the patient's surrogate  understands the proposed procedure, its risks, benefits and alternatives.              Electronically signed by: Nestor Ramp, MD                                                11/30/2022         Date        12:30 PM  Time

## 2022-12-05 ENCOUNTER — Telehealth: Payer: Self-pay | Admitting: Urology

## 2022-12-05 NOTE — Telephone Encounter (Signed)
At this time I contact the patient and scheduled her that her CT scan reveals that both of her kidneys look normal no hydronephrosis or calculi.  No lesions seen.  No collecting defects.  Patient does have a cystoscopy on Friday for the completion of her hematuria workup.

## 2022-12-07 ENCOUNTER — Encounter
Admission: RE | Disposition: A | Discharge status: Home / Self Care | Payer: Self-pay | Source: Ambulatory Visit | Attending: Urology

## 2022-12-07 ENCOUNTER — Ambulatory Visit
Admission: RE | Admit: 2022-12-07 | Discharge: 2022-12-07 | Disposition: A | Discharge status: Home / Self Care | Payer: PRIVATE HEALTH INSURANCE | Source: Ambulatory Visit | Attending: Urology | Admitting: Urology

## 2022-12-07 DIAGNOSIS — R319 Hematuria, unspecified: Secondary | ICD-10-CM

## 2022-12-07 DIAGNOSIS — R310 Gross hematuria: Secondary | ICD-10-CM | POA: Insufficient documentation

## 2022-12-07 HISTORY — PX: PR CYSTOURETHROSCOPY: 52000

## 2022-12-07 SURGERY — CYSTOSCOPY
Anesthesia: Local | Wound class: Clean Contaminated

## 2022-12-07 MED ORDER — CIPROFLOXACIN HCL 500 MG PO TABS *I*
ORAL_TABLET | ORAL | Status: AC
Start: 2022-12-07 — End: 2022-12-07
  Filled 2022-12-07: qty 1

## 2022-12-07 MED ORDER — LIDOCAINE HCL 2 % UROJET *I*
CUTANEOUS | Status: AC
Start: 2022-12-07 — End: 2022-12-07
  Filled 2022-12-07: qty 10

## 2022-12-07 MED ORDER — CIPROFLOXACIN HCL 500 MG PO TABS *I*
500.0000 mg | ORAL_TABLET | Freq: Once | ORAL | Status: AC
Start: 2022-12-07 — End: 2022-12-07
  Administered 2022-12-07: 500 mg via ORAL

## 2022-12-07 SURGICAL SUPPLY — 12 items
DRAPE SUR 3 QTR W53XL77IN SMS POLYPR ~~LOC~~ (Drape) ×1 IMPLANT
GI COMPLIANCE SCOPE KIT (Supply) ×1 IMPLANT
GLOVE SURG BIOGEL PI ULTRATOUCH SZ 7.0 (Glove) ×1 IMPLANT
GOWN SIRUS FABRIC NONREINFORCED SET XL (Gown) ×1 IMPLANT
HIBICLENS LIQUID FLIP-TOP 4OZ (Supply) ×1 IMPLANT
IRRIGATION BAG .9PCT NACL 1000 (Solution) ×1 IMPLANT
SET IRRIGATION CYSTO/TUR (Supply) ×1 IMPLANT
SOL H2O IRRIG 250ML STER (Supply) IMPLANT
SPONGE GAUZE 4INX4IN 12PLY STRL LF 10/TRAY (Supply) ×1 IMPLANT
STOPCOCK 3 WAY MALE LUER LOCK ON/OFF HANDLE LF (Supply) ×1 IMPLANT
SYRINGE LUERLOCK 20CC (Syringe) ×1 IMPLANT
TRAY SURGICAL SCRUB SKIN DRY VINYL 4 COMPARTMENT PREMIUM (Tray) ×1 IMPLANT

## 2022-12-07 NOTE — Op Note (Signed)
Operative Note (Surgical Case/Log ID: [id number])       Date of Surgery: 12/07/2022       Surgeons: Surgeons and Role:     * Nestor Ramp, MD - Primary   Assistants:         Pre-op Diagnosis: Pre-Op Diagnosis Codes:     * Hematuria, unspecified type [R31.9]       Post-op Diagnosis: Post-Op Diagnosis Codes:     * Hematuria, unspecified type [R31.9]       Procedure(s) Performed: Procedure(s) (LRB):  CYSTOSCOPY (N/A)       Anesthesia Type: Anesthesia type not filed in the log.        Fluid Totals: No intake/output data recorded.       Estimated Blood Loss: * No values recorded between 12/07/2022 12:00 AM and 12/07/2022  9:32 AM *       Specimens to Pathology:  * No specimens in log *       Temporary Implants:        Packing:                 Patient Condition: good       Indications: Gross hematuria       Findings (Including unexpected complications): Normal cystoscopy     Description of Procedure: Cystoscopy Procedure Note    Indications and Pre-procedure Diagnosis: Hematuria  Post-oprocedure Diagnosis: Normal cystoscopy  Surgeon: Nestor Ramp, MD    Anesthesia: viscous 1% lidocaine  Procedure Details:   The risks, benefits, complications, treatment options, and expected outcomes were discussed with the patient. The patient concurred with the proposed plan, giving informed consent.  Cystoscopy was performed today, using sterile technique. The patient was placed in the supine position, prepped  and draped in the usual sterile fashion. Cystoscopy was carried out with a flexible cystoscope with video guidance.  Findings:  External meatus: normal  Urethra: normal without strictures and without scarring.   Bladder: Normal mucosa, no lesions seen.  Right ureteral orifice was in normal position and normal appearance. Left ureteral orifice was in normal position and normal appearance. clear efflux was noted from both orifices.  Specimens: urine sent for cytology    Imaging  Abdominal-pelvic CT scan : Unremarkable urinary  pelvicalyceal system and bladder        Assessment and plann:  Presents today for cystoscopy for evaluation of gross hematuria.  Patient has had gross hematuria starting in January and then happening again in March 2024. She denies blood clots but urine is pink-tinged. Patient complains of right upper quadrant pain that she describes as a constant ache with intermittent sharp stabbing pains. She does not have any CVA tenderness or flank tenderness. Patient does have a family history significant with her sister having bladder cancer which metastasized and she passed away from.  CT urogram reveals enlarged retroperitoneal lymph nodes and gallbladder wall thickening.  Bilateral pelvic calyceal system and urinary bladder appears normal.  Her cystoscopy was normal.    Disposition: To home       Signed:  Nestor Ramp, MD  on 12/07/2022 at 9:32 AM

## 2022-12-07 NOTE — Discharge Instructions (Addendum)
Cystoscopy   WHAT YOU NEED TO KNOW:   A cystoscopy is a procedure to look inside your urethra and bladder using a cystoscope. The procedure is used to diagnose and treat conditions of the bladder, urethra, or prostate.     DISCHARGE INSTRUCTIONS:   Call the doctor's office if:   Your urine turns from pink to red, or you have clots in your urine.    You cannot urinate and your bladder feels full.    You have severe pain.    Contact your healthcare provider or urologist if:   Your pain or burning during urination becomes worse or lasts longer than 1 day.    Your urine stays pink for longer than 1 day.    You have a fever and chills.    You urinate less than usual, or still feel like you have to urinate after you use the bathroom.    You have questions or concerns about your condition or care.    Medicines for discharge: none    You may use acetaminophen  if required.  It decreases pain and fever. It is available without a doctor's order. Do not use more than 4 grams (4,000 milligrams) total of acetaminophen in one day.     Take your medicine as directed.  Contact your healthcare provider if you think your medicine is not helping or if you have side effects. Tell him or her if you are allergic to any medicine. Keep a list of the medicines, vitamins, and herbs you take. Include the amounts, and when and why you take them. Bring the list or the pill bottles to follow-up visits. Carry your medicine list with you in case of an emergency.  Follow up with your healthcare provider as directed:  Write down your questions so you remember to ask them during your visits.    Self-care:   Drink 6-8 8 ounce cups of water every day for 2 days  You may return to regular daily activities later today.    Assessment and plan:  Presents today for cystoscopy for evaluation of gross hematuria.  Patient has had gross hematuria starting in January and then happening again in March 2024. She denies blood clots but urine is pink-tinged. Patient  complains of right upper quadrant pain that she describes as a constant ache with intermittent sharp stabbing pains. She does not have any CVA tenderness or flank tenderness. Patient does have a family history significant with her sister having bladder cancer which metastasized and she passed away from.  CT urogram reveals enlarged retroperitoneal lymph nodes and gallbladder wall thickening.  Bilateral pelvic calyceal system and urinary bladder appears normal.  Her cystoscopy was normal.  We will reassess her in 3 months.    Follow-up: With Leann in 3 months.

## 2022-12-07 NOTE — H&P (Signed)
UPDATES TO PATIENT'S CONDITION on the DAY OF SURGERY/PROCEDURE    I. Updates to Patient's Condition (to be completed by a provider privileged to complete a H&P, following reassessment of the patient by the provider):    Day of Surgery/Procedure Update:  History  History reviewed and no change    Physical  Physical exam updated and no change            II. Procedure Readiness   I have reviewed the patient's H&P and updated condition. By completing and signing this form, I attest that this patient is ready for surgery/procedure.    III. Attestation   I have reviewed the updated information regarding the patient's condition and it is appropriate to proceed with the planned surgery/procedure.      Nestor Ramp, MD as of 9:31 AM 12/07/2022

## 2022-12-10 ENCOUNTER — Encounter: Payer: Self-pay | Admitting: Urology

## 2022-12-12 LAB — MEDICAL CYTOLOGY

## 2023-03-06 ENCOUNTER — Ambulatory Visit: Payer: PRIVATE HEALTH INSURANCE | Admitting: Urology

## 2024-01-23 ENCOUNTER — Other Ambulatory Visit: Payer: Self-pay

## 2024-01-23 LAB — LIPID PANEL
Chol/HDL Ratio: 5.76
Cholesterol: 196 mg/dL (ref 107–200)
HDL: 34 mg/dL — ABNORMAL LOW (ref 35–86)
LDL Calculated: 135 mg/dL — ABNORMAL HIGH (ref 0–100)
Triglycerides: 136 mg/dL (ref 35–150)

## 2024-01-23 LAB — CBC AND DIFFERENTIAL
Baso # K/uL: 0.1 X10 3/uL (ref 0.0–0.2)
Basophil %: 1 % (ref 0–2)
Eos # K/uL: 0.4 X10 3/uL (ref 0.0–0.7)
Eosinophil %: 4 % (ref 0–8)
Hematocrit: 49.4 % — ABNORMAL HIGH (ref 37.5–47.7)
Hemoglobin: 15.4 g/dL (ref 12.1–15.8)
Immature Granulocytes Absolute: 0.03 X10 3/uL (ref 0.0–0.031)
Immature Granulocytes: 0.3 % (ref 0.0–0.4)
Lymph # K/uL: 2.7 10*3 (ref 1.0–5.2)
Lymphocyte %: 27 % (ref 13–42)
MCH: 28.8 pg (ref 26.4–33.6)
MCHC: 31.2 g/dL — ABNORMAL LOW (ref 31.9–37.3)
MCV: 93 FL (ref 80–93)
Mean Platelet Volume: 11.8 FL — ABNORMAL HIGH (ref 10.1–10.4)
Mono # K/uL: 0.5 X10 3/uL (ref 0.3–1.1)
Monocyte %: 5 % (ref 2–12)
Neut # K/uL: 6.2 X10 3/uL (ref 2.1–8.0)
Nucl RBC # K/uL: 0 X10 3/uL (ref 0.0–0.012)
Nucl RBC %: 0 % (ref 0.0–0.2)
Platelets: 200 10*3 (ref 144–366)
RBC Distribution Width-SD: 48.1 FL — ABNORMAL HIGH (ref 41.0–45.3)
RBC: 5.34 10*6 (ref 4.16–5.34)
RDW: 14.3 % — ABNORMAL HIGH (ref 12.8–14.2)
Seg Neut %: 63 % (ref 44–79)
WBC: 9.8 10*3 (ref 4.3–11.0)

## 2024-01-23 LAB — COMPREHENSIVE METABOLIC PANEL
ALT: 31 U/L (ref 0–40)
AST: 26 U/L (ref 0–40)
Albumin: 3.7 g/dL (ref 3.5–5.2)
Alk Phos: 93 U/L (ref 35–104)
Anion Gap: 10
Bilirubin,Total: 0.2 mg/dL (ref 0.0–1.0)
CO2: 29 mmol/L (ref 23–32)
Calcium: 9.1 mg/dL (ref 8.5–10.4)
Chloride: 104 mmol/L (ref 98–107)
Creatinine: 0.9 mg/dL (ref 0.5–0.90)
Glucose: 115 mg/dL — ABNORMAL HIGH (ref 70–100)
Lab: 10 mg/dL (ref 6–20)
Potassium: 3.8 mmol/L (ref 3.5–5.1)
Sodium: 142 mmol/L (ref 136–145)
Total Protein: 6.6 g/dL (ref 6.6–8.7)
eGFR BY CREAT: 76

## 2024-01-23 LAB — TSH: TSH: 2.26 u[IU]/mL (ref 0.27–4.20)

## 2024-01-23 LAB — MAGNESIUM: Magnesium: 2 mg/dL (ref 1.7–2.6)

## 2024-01-23 LAB — HEMOGLOBIN A1C: Hemoglobin A1C: 6.7 %

## 2024-01-23 LAB — VITAMIN D: Vitamin D 25 Hydroxy: 29 ng/mL — ABNORMAL LOW (ref 30–100)

## 2024-01-23 LAB — VITAMIN B12: Vitamin B12: 417 pg/mL (ref 211–946)

## 2024-04-17 ENCOUNTER — Encounter: Payer: Self-pay | Admitting: Internal Medicine

## 2024-08-07 ENCOUNTER — Other Ambulatory Visit: Payer: Self-pay
